# Patient Record
Sex: Male | Born: 1950 | Race: Black or African American | Hispanic: No | Marital: Married | Smoking: Former smoker
Health system: Southern US, Community
[De-identification: ages and names within clinical notes are randomized; demographics above are authoritative.]

## PROBLEM LIST (undated history)

## (undated) DIAGNOSIS — I10 Essential (primary) hypertension: Secondary | ICD-10-CM

---

## 1978-12-09 HISTORY — PX: OTHER SURGICAL HISTORY: SHX169

## 2016-05-24 ENCOUNTER — Emergency Department
Admission: EM | Admit: 2016-05-24 | Discharge: 2016-05-24 | Disposition: A | Discharge status: Home / Self Care | Payer: Medicare Other | Attending: Emergency Medicine | Admitting: Emergency Medicine

## 2016-05-24 ENCOUNTER — Emergency Department: Payer: Medicare Other

## 2016-05-24 ENCOUNTER — Encounter: Payer: Self-pay | Admitting: Emergency Medicine

## 2016-05-24 DIAGNOSIS — R200 Anesthesia of skin: Secondary | ICD-10-CM | POA: Diagnosis present

## 2016-05-24 DIAGNOSIS — I160 Hypertensive urgency: Secondary | ICD-10-CM | POA: Insufficient documentation

## 2016-05-24 DIAGNOSIS — I10 Essential (primary) hypertension: Secondary | ICD-10-CM | POA: Diagnosis not present

## 2016-05-24 HISTORY — DX: Essential (primary) hypertension: I10

## 2016-05-24 LAB — BASIC METABOLIC PANEL
ANION GAP: 9 (ref 5–15)
BUN: 13 mg/dL (ref 6–20)
CALCIUM: 8.9 mg/dL (ref 8.9–10.3)
CO2: 24 mmol/L (ref 22–32)
Chloride: 104 mmol/L (ref 101–111)
Creatinine, Ser: 0.75 mg/dL (ref 0.61–1.24)
GFR calc Af Amer: >60 mL/min (ref >60)
GLUCOSE: 95 mg/dL (ref 65–99)
Potassium: 3.8 mmol/L (ref 3.5–5.1)
SODIUM: 137 mmol/L (ref 135–145)

## 2016-05-24 LAB — CBC
HCT: 37.7 % — ABNORMAL LOW (ref 40.0–52.0)
Hemoglobin: 12.2 g/dL — ABNORMAL LOW (ref 13.0–18.0)
MCH: 24.2 pg — ABNORMAL LOW (ref 26.0–34.0)
MCHC: 32.4 g/dL (ref 32.0–36.0)
MCV: 74.7 fL — AB (ref 80.0–100.0)
PLATELETS: 250 10*3/uL (ref 150–440)
RBC: 5.05 MIL/uL (ref 4.40–5.90)
RDW: 16 % — AB (ref 11.5–14.5)
WBC: 3.8 10*3/uL (ref 3.8–10.6)

## 2016-05-24 LAB — TROPONIN I

## 2016-05-24 MED ORDER — HYDROCHLOROTHIAZIDE 12.5 MG PO TABS: 12.5000 mg | ORAL_TABLET | Freq: Every day | ORAL | Status: AC

## 2016-05-24 MED ORDER — HYDROCHLOROTHIAZIDE 25 MG PO TABS
25.0000 mg | ORAL_TABLET | ORAL | Status: AC
  Administered 2016-05-24: 25 mg via ORAL
  Filled 2016-05-24: qty 1

## 2016-05-24 NOTE — Discharge Instructions (Signed)
° °  Return to the Emergency Department (ED) if you experience any chest pain/pressure/tightness, difficulty breathing, or sudden sweating, or other symptoms that concern you.

## 2016-05-24 NOTE — ED Notes (Signed)
NAD noted at time of D/C. Pt denies questions or concerns. Pt ambulatory to the lobby at this time.  

## 2016-05-24 NOTE — ED Notes (Signed)
Pt from [facility]. Pt states for the past 3 weeks he has had numbness and tingling in both hands and blood pressure also elevated while at [facility]. Pt states he has been off of his HTN med (HCTZ) for the past 2 years.  Pt denies any chest pain.

## 2016-05-24 NOTE — ED Provider Notes (Signed)
[facility] Emergency Department Provider Note  ____________________________________________  Time seen: Approximately 1:24 PM  I have reviewed the triage vital signs and the nursing notes.   HISTORY  Chief Complaint Numbness and Hypertension    HPI Nathan Burgess is a 65 y.o. male presents for evaluation of high blood pressure.  Tells me about 3 weeks ago he was seen in the clinic because his urinated to be cleaned out, but they noticed his blood pressure was quite high there and told them to watch it. Over the last few days she is occasionally note some mild sense of nausea, he also reports he has tingling that occasionally occurs in both hands however this is been off and on for years. He has no facial droop, speech is not changed, he has not had a headache, he denies any vision changes. He has not had chest pain shortness of breath or any other symptoms.  He reports that he thinks he used to be on HCTZ, but was taken off of it several years ago as his blood pressure had improved.   Past Medical History  Diagnosis Date  . Hypertension     There are no active problems to display for this patient.   History reviewed. No pertinent past surgical history.  Current Outpatient Rx  Name  Route  Sig  Dispense  Refill  . hydrochlorothiazide (HYDRODIURIL) 12.5 MG tablet   Oral   Take 1 tablet (12.5 mg total) by mouth daily.   21 tablet   0     Allergies Review of patient's allergies indicates no known allergies.  No family history on file.  Social History Social History  Substance Use Topics  . Smoking status: Never Smoker   . Smokeless tobacco: None  . Alcohol Use: None    Review of Systems Constitutional: No fever/chills Eyes: No visual changes. ENT: No sore throat. Cardiovascular: Denies chest pain. Respiratory: Denies shortness of breath. Gastrointestinal: No abdominal pain. No vomiting.  No diarrhea.  No constipation. Genitourinary:  Negative for dysuria. Musculoskeletal: Negative for back pain. Skin: Negative for rash. Neurological: Negative for headaches, focal weakness or numbness except for slight tingling in the fingers of both hands which she reports has happened on and off for years and unchanged.  10-point ROS otherwise negative.  ____________________________________________   PHYSICAL EXAM:  VITAL SIGNS: ED Triage Vitals  Enc Vitals Group     BP 05/24/16 1220 186/108 mmHg     Pulse Rate 05/24/16 1220 67     Resp 05/24/16 1220 18     Temp 05/24/16 1220 98.4 F (36.9 C)     Temp Source 05/24/16 1220 Oral     SpO2 05/24/16 1220 97 %     Weight 05/24/16 1220 192 lb (87.091 kg)     Height 05/24/16 1220 5\' 11"  (1.803 m)     Head Cir --      Peak Flow --      Pain Score 05/24/16 1218 0     Pain Loc --      Pain Edu? --      Excl. in Will? --    Constitutional: Alert and oriented. Well appearing and in no acute distress. Eyes: Conjunctivae are normal. PERRL. EOMI. Head: Atraumatic. Nose: No congestion/rhinnorhea. Mouth/Throat: Mucous membranes are moist.  Oropharynx non-erythematous. Neck: No stridor.   Cardiovascular: Normal rate, regular rhythm. Grossly normal heart sounds.  Good peripheral circulation. Respiratory: Normal respiratory effort.  No retractions. Lungs CTAB. Gastrointestinal: Soft and nontender.  No distention. No abdominal bruits. No CVA tenderness. Musculoskeletal: No lower extremity tenderness nor edema.  No joint effusions. Neurologic:  Normal speech and language. No gross focal neurologic deficits are appreciated.  The patient has no pronator drift. The patient has normal cranial nerve exam. Extraocular movements are normal. Visual fields are normal. Patient has 5 out of 5 strength in all extremities. There is no numbness or gross, acute sensory abnormality in the extremities bilaterally. No speech disturbance. No dysarthria. No aphasia. No ataxia. Normal finger nose finger  bilat. Patient speaking in full and clear sentences. Skin:  Skin is warm, dry and intact. No rash noted. Psychiatric: Mood and affect are normal. Speech and behavior are normal.  ____________________________________________   LABS (all labs ordered are listed, but only abnormal results are displayed)  Labs Reviewed  CBC - Abnormal; Notable for the following:    Hemoglobin 12.2 (*)    HCT 37.7 (*)    MCV 74.7 (*)    MCH 24.2 (*)    RDW 16.0 (*)    All other components within normal limits  BASIC METABOLIC PANEL  TROPONIN I   ____________________________________________  EKG  Reviewed and interpreted by me at 12:30 PM Ventricular rate 70 PR 140 QRS 90 QTc 420 Normal sinus rhythm, probable left ventricular hypertrophy without ischemic change. Slight repolarization abnormality is noted in V1 and V3 which is likely due to LVH ____________________________________________  RADIOLOGY   CT Head Wo Contrast (Final result) Result time: 05/24/16 13:45:59   Final result by Rad Results In Interface (05/24/16 13:45:59)   Narrative:   CLINICAL DATA: Numbness and tingling in both hands for 3 weeks. Initial encounter.  EXAM: CT HEAD WITHOUT CONTRAST  TECHNIQUE: Contiguous axial images were obtained from the base of the skull through the vertex without intravenous contrast.  COMPARISON: None.  FINDINGS: The brain appears normal without hemorrhage, infarct, mass lesion, mass effect, midline shift or abnormal extra-axial fluid collection. No hydrocephalus or pneumocephalus. The calvarium is intact. Imaged paranasal sinuses and mastoid air cells are clear.  IMPRESSION: Negative head CT.   Electronically Signed By: Inge Rise M.D. On: 05/24/2016 13:45    ____________________________________________   PROCEDURES  Procedure(s) performed: None  Critical Care performed: No  ____________________________________________   INITIAL IMPRESSION / ASSESSMENT  AND PLAN / ED COURSE  Pertinent labs & imaging results that were available during my care of the patient were reviewed by me and considered in my medical decision making (see chart for details).  Patient has for evaluation of some nausea, associated with hypertension. Very reassuring clinical examination. No cardiac or pulmonary symptoms. His blood pressure is 171/98 here, and I believe that he is appropriate to initiate outpatient treatment. There are no signs symptoms to suggest hypertensive emergency. No chest pain, no neuro deficits. He does report paresthesias hands but tells me these of an off-and-on for several years, does not appear to be an acute component.  Patient has a primary care doctor whom he can follow up closely with, we'll provide prescription for hydrochlorothiazide until he can see his primary. I did discuss reviewed careful return precautions with the patient. Return precautions and treatment recommendations and follow-up discussed with the patient who is agreeable with the plan.  ____________________________________________   FINAL CLINICAL IMPRESSION(S) / ED DIAGNOSES  Final diagnoses:  Hypertensive urgency      Delman Kitten, MD 05/25/16 [phone]

## 2019-12-07 ENCOUNTER — Other Ambulatory Visit: Payer: Self-pay | Admitting: Family Medicine

## 2019-12-07 DIAGNOSIS — Z87891 Personal history of nicotine dependence: Secondary | ICD-10-CM

## 2019-12-14 ENCOUNTER — Encounter: Payer: Self-pay | Admitting: Oncology

## 2019-12-14 NOTE — Progress Notes (Signed)
Patient referred by f Dr Netty Starring for decreased white blood cell count, pt is aware. Spoke to patient about what to expect at appointment tomorrow and he voiced understanding. Pt was also prescreened. No other concerns voiced.

## 2019-12-15 ENCOUNTER — Encounter (INDEPENDENT_AMBULATORY_CARE_PROVIDER_SITE_OTHER): Payer: Self-pay

## 2019-12-15 ENCOUNTER — Encounter: Payer: Self-pay | Admitting: Oncology

## 2019-12-15 ENCOUNTER — Inpatient Hospital Stay: Payer: Medicare Other

## 2019-12-15 ENCOUNTER — Inpatient Hospital Stay: Payer: Medicare Other | Attending: Oncology | Admitting: Oncology

## 2019-12-15 ENCOUNTER — Other Ambulatory Visit: Payer: Self-pay

## 2019-12-15 VITALS — BP 150/70 | HR 89 | Temp 97.3°F | Resp 18 | Wt 183.3 lb

## 2019-12-15 DIAGNOSIS — D509 Iron deficiency anemia, unspecified: Secondary | ICD-10-CM | POA: Diagnosis not present

## 2019-12-15 DIAGNOSIS — I10 Essential (primary) hypertension: Secondary | ICD-10-CM | POA: Insufficient documentation

## 2019-12-15 DIAGNOSIS — D72819 Decreased white blood cell count, unspecified: Secondary | ICD-10-CM

## 2019-12-15 DIAGNOSIS — D563 Thalassemia minor: Secondary | ICD-10-CM | POA: Diagnosis not present

## 2019-12-15 DIAGNOSIS — Z87891 Personal history of nicotine dependence: Secondary | ICD-10-CM | POA: Diagnosis not present

## 2019-12-15 DIAGNOSIS — Z79899 Other long term (current) drug therapy: Secondary | ICD-10-CM | POA: Insufficient documentation

## 2019-12-15 DIAGNOSIS — Z791 Long term (current) use of non-steroidal anti-inflammatories (NSAID): Secondary | ICD-10-CM | POA: Insufficient documentation

## 2019-12-15 LAB — HEPATITIS B SURFACE ANTIGEN: Hepatitis B Surface Ag: NONREACTIVE

## 2019-12-15 LAB — COMPREHENSIVE METABOLIC PANEL
ALT: 15 U/L (ref 0–44)
AST: 19 U/L (ref 15–41)
Albumin: 4.6 g/dL (ref 3.5–5.0)
Alkaline Phosphatase: 61 U/L (ref 38–126)
Anion gap: 11 (ref 5–15)
BUN: 22 mg/dL (ref 8–23)
CO2: 23 mmol/L (ref 22–32)
Calcium: 9.6 mg/dL (ref 8.9–10.3)
Chloride: 101 mmol/L (ref 98–111)
Creatinine, Ser: 0.98 mg/dL (ref 0.61–1.24)
GFR calc Af Amer: >60 mL/min (ref >60)
GFR calc non Af Amer: >60 mL/min (ref >60)
Glucose, Bld: 101 mg/dL — ABNORMAL HIGH (ref 70–99)
Potassium: 4 mmol/L (ref 3.5–5.1)
Sodium: 135 mmol/L (ref 135–145)
Total Bilirubin: 0.5 mg/dL (ref 0.3–1.2)
Total Protein: 7.2 g/dL (ref 6.5–8.1)

## 2019-12-15 LAB — IRON AND TIBC
Iron: 84 ug/dL (ref 45–182)
Saturation Ratios: 25 % (ref 17.9–39.5)
TIBC: 339 ug/dL (ref 250–450)
UIBC: 255 ug/dL

## 2019-12-15 LAB — VITAMIN B12: Vitamin B-12: 286 pg/mL (ref 180–914)

## 2019-12-15 LAB — FERRITIN: Ferritin: 245 ng/mL (ref 24–336)

## 2019-12-15 LAB — TSH: TSH: 1.184 u[IU]/mL (ref 0.350–4.500)

## 2019-12-15 LAB — CBC WITH DIFFERENTIAL/PLATELET
Abs Immature Granulocytes: 0.01 10*3/uL (ref 0.00–0.07)
Basophils Absolute: 0 10*3/uL (ref 0.0–0.1)
Basophils Relative: 1 %
Eosinophils Absolute: 0.1 10*3/uL (ref 0.0–0.5)
Eosinophils Relative: 3 %
HCT: 36.4 % — ABNORMAL LOW (ref 39.0–52.0)
Hemoglobin: 11.5 g/dL — ABNORMAL LOW (ref 13.0–17.0)
Immature Granulocytes: 0 %
Lymphocytes Relative: 36 %
Lymphs Abs: 1 10*3/uL (ref 0.7–4.0)
MCH: 25.1 pg — ABNORMAL LOW (ref 26.0–34.0)
MCHC: 31.6 g/dL (ref 30.0–36.0)
MCV: 79.3 fL — ABNORMAL LOW (ref 80.0–100.0)
Monocytes Absolute: 0.1 10*3/uL (ref 0.1–1.0)
Monocytes Relative: 3 %
Neutro Abs: 1.7 10*3/uL (ref 1.7–7.7)
Neutrophils Relative %: 57 %
Platelets: 309 10*3/uL (ref 150–400)
RBC: 4.59 MIL/uL (ref 4.22–5.81)
RDW: 15.4 % (ref 11.5–15.5)
WBC: 2.9 10*3/uL — ABNORMAL LOW (ref 4.0–10.5)
nRBC: 0 % (ref 0.0–0.2)

## 2019-12-15 LAB — FOLATE: Folate: 11.6 ng/mL (ref >5.9)

## 2019-12-15 LAB — HEPATITIS C ANTIBODY: HCV Ab: NONREACTIVE

## 2019-12-15 LAB — TECHNOLOGIST SMEAR REVIEW
RBC Morphology: NORMAL
Tech Review: NORMAL

## 2019-12-15 LAB — HIV ANTIBODY (ROUTINE TESTING W REFLEX): HIV Screen 4th Generation wRfx: NONREACTIVE

## 2019-12-15 LAB — LACTATE DEHYDROGENASE: LDH: 108 U/L (ref 98–192)

## 2019-12-15 NOTE — Progress Notes (Signed)
Hematology/Oncology Consult note [facility] Telephone:([phone] [phone]   Patient Care Team: Dion Body, MD as PCP - General (Family Medicine)  REFERRING PROVIDER: Dion Body, MD  CHIEF COMPLAINTS/REASON FOR VISIT:  Evaluation of leukopenia  HISTORY OF PRESENTING ILLNESS:  Nathan Burgess is a 69 y.o. male who was seen in consultation at the request of Dion Body, MD for evaluation of leukopenia. Reviewed patient's recent labs.  11/30/2019 CBC patient has low total WBC count was 2.9, decreased neutrophil and monocyte increased lymphocyte percentage. Previous lab records reviewed. Leukopenia duration is chronic onset, duration is since June 2019 and at that time his total white count was mildly low at 3.7. Patient also has mild anemia with hemoglobin of 11.8, MCV 79.7 on 11/30/2019.  Chronic onset, anemia dated back to 2017. No aggravating or improving factors.  Associated symptoms:  denies fatigue, weight loss, fever, chills, frequent infection.  History hepatitis or HIV infection: Denies History of chronic liver disease: Denies Alcohol consumption: Not currently Diet Vegetarian or Vegan: Denies Herbal medication: Denies Patient denies night sweating, unintentional weight loss, fever, chills.  He feels that his energy level has decreased in the past few years, otherwise he feels well   Review of Systems  Constitutional: Negative for appetite change, chills, fatigue, fever and unexpected weight change.  HENT:   Negative for hearing loss and voice change.   Eyes: Negative for eye problems and icterus.  Respiratory: Negative for chest tightness, cough and shortness of breath.   Cardiovascular: Negative for chest pain and leg swelling.  Gastrointestinal: Negative for abdominal distention and abdominal pain.  Endocrine: Negative for hot flashes.  Genitourinary: Negative for difficulty urinating, dysuria and frequency.     Musculoskeletal: Negative for arthralgias.  Skin: Negative for itching and rash.  Neurological: Negative for light-headedness and numbness.  Hematological: Negative for adenopathy. Does not bruise/bleed easily.  Psychiatric/Behavioral: Negative for confusion.    MEDICAL HISTORY:  Past Medical History:  Diagnosis Date  . Hypertension     SURGICAL HISTORY: Past Surgical History:  Procedure Laterality Date  . hernia repari  1980   2 herania repair surgeries    SOCIAL HISTORY: Social History   Socioeconomic History  . Marital status: Married    Spouse name: Not on file  . Number of children: Not on file  . Years of education: Not on file  . Highest education level: Not on file  Occupational History  . Occupation: retired   Tobacco Use  . Smoking status: Former Smoker    Packs/day: 1.00    Years: 20.00    Pack years: 20.00    Quit date: 1985    Years since quitting: 36.0  . Smokeless tobacco: Never Used  Substance and Sexual Activity  . Alcohol use: Never  . Drug use: Never  . Sexual activity: Not on file  Other Topics Concern  . Not on file  Social History Narrative  . Not on file   Social Determinants of Health   Financial Resource Strain:   . Difficulty of Paying Living Expenses: Not on file  Food Insecurity:   . Worried About Charity fundraiser in the Last Year: Not on file  . Ran Out of Food in the Last Year: Not on file  Transportation Needs:   . Lack of Transportation (Medical): Not on file  . Lack of Transportation (Non-Medical): Not on file  Physical Activity:   . Days of Exercise per Week: Not on file  . Minutes  of Exercise per Session: Not on file  Stress:   . Feeling of Stress : Not on file  Social Connections:   . Frequency of Communication with Friends and Family: Not on file  . Frequency of Social Gatherings with Friends and Family: Not on file  . Attends Religious Services: Not on file  . Active Member of Clubs or Organizations: Not on  file  . Attends Archivist Meetings: Not on file  . Marital Status: Not on file  Intimate Partner Violence:   . Fear of Current or Ex-Partner: Not on file  . Emotionally Abused: Not on file  . Physically Abused: Not on file  . Sexually Abused: Not on file    FAMILY HISTORY: Family History  Problem Relation Age of Onset  . Dementia Mother   . Dementia Father     ALLERGIES:  has No Known Allergies.  MEDICATIONS:  Current Outpatient Medications  Medication Sig Dispense Refill  . ferrous sulfate 325 (65 FE) MG tablet Take 325 mg by mouth daily with breakfast.    . hydrochlorothiazide (HYDRODIURIL) 12.5 MG tablet Take 1 tablet (12.5 mg total) by mouth daily. (Patient taking differently: Take 25 mg by mouth daily. ) 21 tablet 0  . ibuprofen (GOODSENSE IBUPROFEN) 200 MG tablet Take 200 mg by mouth every 6 (six) hours as needed.    Marland Kitchen lisinopril (ZESTRIL) 10 MG tablet Take 10 mg by mouth daily.      No current facility-administered medications for this visit.     PHYSICAL EXAMINATION: ECOG PERFORMANCE STATUS: 0 - Asymptomatic Vitals:   12/15/19 1100  BP: (!) 150/70  Pulse: 89  Resp: 18  Temp: (!) 97.3 F (36.3 C)   Filed Weights   12/15/19 1100  Weight: 183 lb 4.8 oz (83.1 kg)    Physical Exam Constitutional:      General: He is not in acute distress. HENT:     Head: Normocephalic and atraumatic.  Eyes:     General: No scleral icterus.    Pupils: Pupils are equal, round, and reactive to light.  Cardiovascular:     Rate and Rhythm: Normal rate and regular rhythm.     Heart sounds: Normal heart sounds.  Pulmonary:     Effort: Pulmonary effort is normal. No respiratory distress.     Breath sounds: No wheezing.  Abdominal:     General: Bowel sounds are normal. There is no distension.     Palpations: Abdomen is soft. There is no mass.     Tenderness: There is no abdominal tenderness.  Musculoskeletal:        General: No deformity. Normal range of motion.       Cervical back: Normal range of motion and neck supple.  Skin:    General: Skin is warm and dry.     Findings: No erythema or rash.  Neurological:     Mental Status: He is alert and oriented to person, place, and time.     Cranial Nerves: No cranial nerve deficit.     Coordination: Coordination normal.  Psychiatric:        Mood and Affect: Mood normal.     RADIOGRAPHIC STUDIES: I have personally reviewed the radiological images as listed and agreed with the findings in the report.  CMP Latest Ref Rng & Units 12/15/2019  Glucose 70 - 99 mg/dL 101(H)  BUN 8 - 23 mg/dL 22  Creatinine 0.61 - 1.24 mg/dL 0.98  Sodium 135 - 145 mmol/L 135  Potassium  3.5 - 5.1 mmol/L 4.0  Chloride 98 - 111 mmol/L 101  CO2 22 - 32 mmol/L 23  Calcium 8.9 - 10.3 mg/dL 9.6  Total Protein 6.5 - 8.1 g/dL 7.2  Total Bilirubin 0.3 - 1.2 mg/dL 0.5  Alkaline Phos 38 - 126 U/L 61  AST 15 - 41 U/L 19  ALT 0 - 44 U/L 15   CBC Latest Ref Rng & Units 12/15/2019  WBC 4.0 - 10.5 K/uL 2.9(L)  Hemoglobin 13.0 - 17.0 g/dL 11.5(L)  Hematocrit 39.0 - 52.0 % 36.4(L)  Platelets 150 - 400 K/uL 309    LABORATORY DATA:  I have reviewed the data as listed Lab Results  Component Value Date   WBC 2.9 (L) 12/15/2019   HGB 11.5 (L) 12/15/2019   HCT 36.4 (L) 12/15/2019   MCV 79.3 (L) 12/15/2019   PLT 309 12/15/2019   Recent Labs    12/15/19 1122  NA 135  K 4.0  CL 101  CO2 23  GLUCOSE 101*  BUN 22  CREATININE 0.98  CALCIUM 9.6  GFRNONAA >60  GFRAA >60  PROT 7.2  ALBUMIN 4.6  AST 19  ALT 15  ALKPHOS 61  BILITOT 0.5   Iron/TIBC/Ferritin/ %Sat    Component Value Date/Time   IRON 84 12/15/2019 1122   TIBC 339 12/15/2019 1122   FERRITIN 245 12/15/2019 1122   IRONPCTSAT 25 12/15/2019 1122     RADIOGRAPHIC STUDIES: I have personally reviewed the radiological images as listed and agreed with the findings in the report. No results found.    ASSESSMENT & PLAN:  1. Leukopenia, unspecified type   2.  Microcytic anemia    Leukopenia I discussed with patient that the differential diagnosis of the leukopenia  is broad, including infection, inflammation, nutrition deficiency, malignant etiology including underlying bone morrow disorders.  For the work up of patient's leukopenia, I recommend checking CBC;CMP, LDH; smear review, folate, Vitamin B12, hepatitis, HIV,   Flowcytometry.  TSH  #Chronic microcytic anemia, check iron panel.  Check hemoglobinopathy evaluation # Patient follow-up with me in approximately 2 weeks to review the above results.   Orders Placed This Encounter  Procedures  . CBC with Differential    Standing Status:   Future    Number of Occurrences:   1    Standing Expiration Date:   12/14/2020  . Comprehensive metabolic panel    Standing Status:   Future    Number of Occurrences:   1    Standing Expiration Date:   12/14/2020  . Vitamin B12    Standing Status:   Future    Number of Occurrences:   1    Standing Expiration Date:   12/14/2020  . Folate    Standing Status:   Future    Number of Occurrences:   1    Standing Expiration Date:   12/14/2020  . Flow cytometry panel-leukemia/lymphoma work-up    Standing Status:   Future    Number of Occurrences:   1    Standing Expiration Date:   12/14/2020  . Lactate dehydrogenase    Standing Status:   Future    Number of Occurrences:   1    Standing Expiration Date:   12/14/2020  . Technologist smear review    Standing Status:   Future    Number of Occurrences:   1    Standing Expiration Date:   12/14/2020  . TSH    Standing Status:   Future    Number  of Occurrences:   1    Standing Expiration Date:   12/14/2020  . Iron and TIBC    Standing Status:   Future    Number of Occurrences:   1    Standing Expiration Date:   12/14/2020  . Ferritin    Standing Status:   Future    Number of Occurrences:   1    Standing Expiration Date:   12/14/2020  . Hemoglobinopathy evaluation    Standing Status:   Future    Number of Occurrences:    1    Standing Expiration Date:   12/14/2020  . HIV antibody    Standing Status:   Future    Number of Occurrences:   1    Standing Expiration Date:   12/14/2020  . Hepatitis B surface antigen    Standing Status:   Future    Number of Occurrences:   1    Standing Expiration Date:   12/14/2020  . Hepatitis C antibody    Standing Status:   Future    Number of Occurrences:   1    Standing Expiration Date:   12/14/2020    All questions were answered. The patient knows to call the clinic with any problems questions or concerns.  Cc Dion Body, MD  Return of visit: 2 weeks Thank you for this kind referral and the opportunity to participate in the care of this patient. A copy of today's note is routed to referring provider   Total face to face encounter time for this patient visit was 45 min. >50% of the time was  spent in counseling and coordination of care.    Earlie Server, MD, PhD Hematology Oncology [facility] Pager- [phone] 12/15/2019

## 2019-12-16 LAB — HEMOGLOBINOPATHY EVALUATION
Hgb A2 Quant: 5.6 % — ABNORMAL HIGH (ref 1.8–3.2)
Hgb A: 92.7 % — ABNORMAL LOW (ref 96.4–98.8)
Hgb C: 0 %
Hgb F Quant: 1.7 % (ref 0.0–2.0)
Hgb S Quant: 0 %
Hgb Variant: 0 %

## 2019-12-17 ENCOUNTER — Other Ambulatory Visit: Payer: Self-pay

## 2019-12-17 ENCOUNTER — Ambulatory Visit
Admission: RE | Admit: 2019-12-17 | Discharge: 2019-12-17 | Disposition: A | Discharge status: Home / Self Care | Payer: Medicare Other | Source: Ambulatory Visit | Attending: Family Medicine | Admitting: Family Medicine

## 2019-12-17 DIAGNOSIS — Z87891 Personal history of nicotine dependence: Secondary | ICD-10-CM | POA: Insufficient documentation

## 2019-12-17 DIAGNOSIS — Z136 Encounter for screening for cardiovascular disorders: Secondary | ICD-10-CM | POA: Insufficient documentation

## 2019-12-20 LAB — COMP PANEL: LEUKEMIA/LYMPHOMA

## 2019-12-30 ENCOUNTER — Inpatient Hospital Stay (HOSPITAL_BASED_OUTPATIENT_CLINIC_OR_DEPARTMENT_OTHER): Payer: Medicare Other | Admitting: Oncology

## 2019-12-30 ENCOUNTER — Encounter: Payer: Self-pay | Admitting: Oncology

## 2019-12-30 DIAGNOSIS — D563 Thalassemia minor: Secondary | ICD-10-CM

## 2019-12-30 DIAGNOSIS — D72819 Decreased white blood cell count, unspecified: Secondary | ICD-10-CM | POA: Diagnosis not present

## 2019-12-30 MED ORDER — VITAMIN B-12 1000 MCG PO TABS: 1000.0000 ug | ORAL_TABLET | Freq: Every day | ORAL | 1 refills | Status: AC

## 2019-12-30 NOTE — Progress Notes (Signed)
Patient verified using two identifiers for virtual visit via telephone today.  Patient does not offer any problems today.  

## 2020-01-02 NOTE — Progress Notes (Signed)
HEMATOLOGY-ONCOLOGY TeleHEALTH VISIT PROGRESS NOTE  I connected with Nathan Burgess on 01/02/20 at 10:45 AM EST by video enabled telemedicine visit and verified that I am speaking with the correct person using two identifiers. I discussed the limitations, risks, security and privacy concerns of performing an evaluation and management service by telemedicine and the availability of in-person appointments. I also discussed with the patient that there may be a patient responsible charge related to this service. The patient expressed understanding and agreed to proceed.   Other persons participating in the visit and their role in the encounter:  None  Patient's location: Home  Provider's location: office Chief Complaint: Leukopenia.   INTERVAL HISTORY Nathan Burgess is a 69 y.o. male who has above history reviewed by me today presents for follow up visit for management of leukopenia Problems and complaints are listed below:  Patient has had lab work-up done and present virtually to discuss results.  He has no new complaints.  Feeling well.  Review of Systems  Constitutional: Negative for appetite change, chills, fatigue, fever and unexpected weight change.  HENT:   Negative for hearing loss and voice change.   Eyes: Negative for eye problems and icterus.  Respiratory: Negative for chest tightness, cough and shortness of breath.   Cardiovascular: Negative for chest pain and leg swelling.  Gastrointestinal: Negative for abdominal distention and abdominal pain.  Endocrine: Negative for hot flashes.  Genitourinary: Negative for difficulty urinating, dysuria and frequency.   Musculoskeletal: Negative for arthralgias.  Skin: Negative for itching and rash.  Neurological: Negative for light-headedness and numbness.  Hematological: Negative for adenopathy. Does not bruise/bleed easily.  Psychiatric/Behavioral: Negative for confusion.    Past Medical History:  Diagnosis Date  . Hypertension    Past  Surgical History:  Procedure Laterality Date  . hernia repari  1980   2 herania repair surgeries    Family History  Problem Relation Age of Onset  . Dementia Mother   . Dementia Father     Social History   Socioeconomic History  . Marital status: Married    Spouse name: Not on file  . Number of children: Not on file  . Years of education: Not on file  . Highest education level: Not on file  Occupational History  . Occupation: retired   Tobacco Use  . Smoking status: Former Smoker    Packs/day: 1.00    Years: 20.00    Pack years: 20.00    Quit date: 1985    Years since quitting: 36.0  . Smokeless tobacco: Never Used  Substance and Sexual Activity  . Alcohol use: Never  . Drug use: Never  . Sexual activity: Not on file  Other Topics Concern  . Not on file  Social History Narrative  . Not on file   Social Determinants of Health   Financial Resource Strain:   . Difficulty of Paying Living Expenses: Not on file  Food Insecurity:   . Worried About Charity fundraiser in the Last Year: Not on file  . Ran Out of Food in the Last Year: Not on file  Transportation Needs:   . Lack of Transportation (Medical): Not on file  . Lack of Transportation (Non-Medical): Not on file  Physical Activity:   . Days of Exercise per Week: Not on file  . Minutes of Exercise per Session: Not on file  Stress:   . Feeling of Stress : Not on file  Social Connections:   . Frequency of Communication with  Friends and Family: Not on file  . Frequency of Social Gatherings with Friends and Family: Not on file  . Attends Religious Services: Not on file  . Active Member of Clubs or Organizations: Not on file  . Attends Archivist Meetings: Not on file  . Marital Status: Not on file  Intimate Partner Violence:   . Fear of Current or Ex-Partner: Not on file  . Emotionally Abused: Not on file  . Physically Abused: Not on file  . Sexually Abused: Not on file    Current Outpatient  Medications on File Prior to Visit  Medication Sig Dispense Refill  . hydrochlorothiazide (HYDRODIURIL) 12.5 MG tablet Take 1 tablet (12.5 mg total) by mouth daily. (Patient taking differently: Take 25 mg by mouth daily. ) 21 tablet 0  . ibuprofen (GOODSENSE IBUPROFEN) 200 MG tablet Take 200 mg by mouth every 6 (six) hours as needed.    Marland Kitchen lisinopril (ZESTRIL) 10 MG tablet Take 10 mg by mouth daily.      No current facility-administered medications on file prior to visit.    No Known Allergies     Observations/Objective: Today's Vitals   12/30/19 1042  PainSc: 0-No pain   There is no height or weight on file to calculate BMI.  Physical Exam  Constitutional: No distress.  Psychiatric: Mood normal.    CBC    Component Value Date/Time   WBC 2.9 (L) 12/15/2019 1122   RBC 4.59 12/15/2019 1122   HGB 11.5 (L) 12/15/2019 1122   HCT 36.4 (L) 12/15/2019 1122   PLT 309 12/15/2019 1122   MCV 79.3 (L) 12/15/2019 1122   MCH 25.1 (L) 12/15/2019 1122   MCHC 31.6 12/15/2019 1122   RDW 15.4 12/15/2019 1122   LYMPHSABS 1.0 12/15/2019 1122   MONOABS 0.1 12/15/2019 1122   EOSABS 0.1 12/15/2019 1122   BASOSABS 0.0 12/15/2019 1122    CMP     Component Value Date/Time   NA 135 12/15/2019 1122   K 4.0 12/15/2019 1122   CL 101 12/15/2019 1122   CO2 23 12/15/2019 1122   GLUCOSE 101 (H) 12/15/2019 1122   BUN 22 12/15/2019 1122   CREATININE 0.98 12/15/2019 1122   CALCIUM 9.6 12/15/2019 1122   PROT 7.2 12/15/2019 1122   ALBUMIN 4.6 12/15/2019 1122   AST 19 12/15/2019 1122   ALT 15 12/15/2019 1122   ALKPHOS 61 12/15/2019 1122   BILITOT 0.5 12/15/2019 1122   GFRNONAA >60 12/15/2019 1122   GFRAA >60 12/15/2019 1122     Assessment and Plan: 1. Leukopenia, unspecified type   2. Beta thalassemia minor     Labs are reviewed and discussed with patient. Chronic leukopenia.  Predominantly lymphocytopenia.  Work-up negative for hepatitis, HIV, negative flow cytometry. Vitamin B12 level is low  normal end 286.  I recommend patient to start oral vitamin B12 1000 MCG p.o. daily Supplementation.  Recommend observation for mild leukocytopenia.  Microcytic anemia, iron panel was checked and is normal.  Not consistent with iron deficiency.  Advised patient to start iron supplementation. Hemoglobinopathy evaluation showed that patient has beta thalassemia minor which explains his mild microcytic anemia.    Follow Up Instructions: 6 months   I discussed the assessment and treatment plan with the patient. The patient was provided an opportunity to ask questions and all were answered. The patient agreed with the plan and demonstrated an understanding of the instructions.  The patient was advised to call back or seek an in-person evaluation if  the symptoms worsen or if the condition fails to improve as anticipated.    Earlie Server, MD 01/02/2020 3:56 PM

## 2020-01-31 ENCOUNTER — Other Ambulatory Visit: Payer: Self-pay | Admitting: Neurology

## 2020-01-31 DIAGNOSIS — G250 Essential tremor: Secondary | ICD-10-CM

## 2020-02-10 ENCOUNTER — Other Ambulatory Visit: Payer: Self-pay

## 2020-02-10 ENCOUNTER — Ambulatory Visit
Admission: RE | Admit: 2020-02-10 | Discharge: 2020-02-10 | Disposition: A | Discharge status: Home / Self Care | Payer: Medicare Other | Source: Ambulatory Visit | Attending: Neurology | Admitting: Neurology

## 2020-02-10 DIAGNOSIS — G250 Essential tremor: Secondary | ICD-10-CM

## 2020-03-24 ENCOUNTER — Other Ambulatory Visit: Payer: Self-pay

## 2020-03-24 ENCOUNTER — Inpatient Hospital Stay: Payer: Medicare Other | Attending: Oncology

## 2020-03-24 DIAGNOSIS — E538 Deficiency of other specified B group vitamins: Secondary | ICD-10-CM | POA: Diagnosis not present

## 2020-03-24 DIAGNOSIS — D72819 Decreased white blood cell count, unspecified: Secondary | ICD-10-CM | POA: Diagnosis not present

## 2020-03-24 DIAGNOSIS — Z791 Long term (current) use of non-steroidal anti-inflammatories (NSAID): Secondary | ICD-10-CM | POA: Diagnosis not present

## 2020-03-24 DIAGNOSIS — D563 Thalassemia minor: Secondary | ICD-10-CM | POA: Diagnosis not present

## 2020-03-24 DIAGNOSIS — D509 Iron deficiency anemia, unspecified: Secondary | ICD-10-CM | POA: Diagnosis not present

## 2020-03-24 DIAGNOSIS — Z87891 Personal history of nicotine dependence: Secondary | ICD-10-CM | POA: Diagnosis not present

## 2020-03-24 DIAGNOSIS — Z79899 Other long term (current) drug therapy: Secondary | ICD-10-CM | POA: Insufficient documentation

## 2020-03-24 DIAGNOSIS — I10 Essential (primary) hypertension: Secondary | ICD-10-CM | POA: Diagnosis not present

## 2020-03-24 LAB — CBC WITH DIFFERENTIAL/PLATELET
Abs Immature Granulocytes: 0.01 K/uL (ref 0.00–0.07)
Basophils Absolute: 0 K/uL (ref 0.0–0.1)
Basophils Relative: 1 %
Eosinophils Absolute: 0.1 K/uL (ref 0.0–0.5)
Eosinophils Relative: 2 %
HCT: 33.9 % — ABNORMAL LOW (ref 39.0–52.0)
Hemoglobin: 11.2 g/dL — ABNORMAL LOW (ref 13.0–17.0)
Immature Granulocytes: 0 %
Lymphocytes Relative: 49 %
Lymphs Abs: 1.5 K/uL (ref 0.7–4.0)
MCH: 25.3 pg — ABNORMAL LOW (ref 26.0–34.0)
MCHC: 33 g/dL (ref 30.0–36.0)
MCV: 76.5 fL — ABNORMAL LOW (ref 80.0–100.0)
Monocytes Absolute: 0.1 K/uL (ref 0.1–1.0)
Monocytes Relative: 4 %
Neutro Abs: 1.3 K/uL — ABNORMAL LOW (ref 1.7–7.7)
Neutrophils Relative %: 44 %
Platelets: 297 K/uL (ref 150–400)
RBC: 4.43 MIL/uL (ref 4.22–5.81)
RDW: 15.5 % (ref 11.5–15.5)
WBC: 3.1 K/uL — ABNORMAL LOW (ref 4.0–10.5)
nRBC: 0 % (ref 0.0–0.2)

## 2020-03-24 LAB — IRON AND TIBC
Iron: 82 ug/dL (ref 45–182)
Saturation Ratios: 27 % (ref 17.9–39.5)
TIBC: 308 ug/dL (ref 250–450)
UIBC: 226 ug/dL

## 2020-03-24 LAB — VITAMIN B12: Vitamin B-12: 1304 pg/mL — ABNORMAL HIGH (ref 180–914)

## 2020-03-24 LAB — FERRITIN: Ferritin: 197 ng/mL (ref 24–336)

## 2020-03-27 ENCOUNTER — Other Ambulatory Visit: Payer: Self-pay

## 2020-03-27 ENCOUNTER — Inpatient Hospital Stay: Payer: Medicare Other

## 2020-03-27 ENCOUNTER — Encounter: Payer: Self-pay | Admitting: Oncology

## 2020-03-27 ENCOUNTER — Inpatient Hospital Stay (HOSPITAL_BASED_OUTPATIENT_CLINIC_OR_DEPARTMENT_OTHER): Payer: Medicare Other | Admitting: Oncology

## 2020-03-27 VITALS — BP 129/83 | HR 88 | Temp 96.7°F | Resp 16 | Wt 180.1 lb

## 2020-03-27 DIAGNOSIS — D72819 Decreased white blood cell count, unspecified: Secondary | ICD-10-CM

## 2020-03-27 DIAGNOSIS — D563 Thalassemia minor: Secondary | ICD-10-CM | POA: Diagnosis not present

## 2020-03-27 DIAGNOSIS — D509 Iron deficiency anemia, unspecified: Secondary | ICD-10-CM

## 2020-03-27 NOTE — Progress Notes (Signed)
Hematology/Oncology Consult note [facility] [phone] [phone]   Patient Care Team: Dion Body, MD as PCP - General (Family Medicine)  REFERRING PROVIDER: Dion Body, MD  CHIEF COMPLAINTS/REASON FOR VISIT:  Evaluation of leukopenia  HISTORY OF PRESENTING ILLNESS:  Nathan Burgess is a 69 y.o. male who was seen in consultation at the request of Dion Body, MD for evaluation of leukopenia. Reviewed patient's recent labs.  11/30/2019 CBC patient has low total WBC count was 2.9, decreased neutrophil and monocyte increased lymphocyte percentage. Previous lab records reviewed. Leukopenia duration is chronic onset, duration is since June 2019 and at that time his total white count was mildly low at 3.7. Patient also has mild anemia with hemoglobin of 11.8, MCV 79.7 on 11/30/2019.  Chronic onset, anemia dated back to 2017. No aggravating or improving factors.  Associated symptoms:  denies fatigue, weight loss, fever, chills, frequent infection.  History hepatitis or HIV infection: Denies History of chronic liver disease: Denies Alcohol consumption: Not currently Diet Vegetarian or Vegan: Denies Herbal medication: Denies Patient denies night sweating, unintentional weight loss, fever, chills.  He feels that his energy level has decreased in the past few years, otherwise he feels well  INTERVAL HISTORY Bretton Mayne is a 69 y.o. male who has above history reviewed by me today presents for follow up visit for management of leukopenia Problems and complaints are listed below: Patient has no new complaints today.  He has recently received 2 doses of COVID-19 vaccination Denies weight loss, fever, chills, fatigue, night sweats.      Review of Systems  Constitutional: Negative for appetite change, chills, fatigue, fever and unexpected weight change.  HENT:   Negative for hearing loss and voice change.   Eyes: Negative for eye  problems and icterus.  Respiratory: Negative for chest tightness, cough and shortness of breath.   Cardiovascular: Negative for chest pain and leg swelling.  Gastrointestinal: Negative for abdominal distention and abdominal pain.  Endocrine: Negative for hot flashes.  Genitourinary: Negative for difficulty urinating, dysuria and frequency.   Musculoskeletal: Negative for arthralgias.  Skin: Negative for itching and rash.  Neurological: Negative for light-headedness and numbness.  Hematological: Negative for adenopathy. Does not bruise/bleed easily.  Psychiatric/Behavioral: Negative for confusion.    MEDICAL HISTORY:  Past Medical History:  Diagnosis Date  . Hypertension     SURGICAL HISTORY: Past Surgical History:  Procedure Laterality Date  . hernia repari  1980   2 herania repair surgeries    SOCIAL HISTORY: Social History   Socioeconomic History  . Marital status: Married    Spouse name: Not on file  . Number of children: Not on file  . Years of education: Not on file  . Highest education level: Not on file  Occupational History  . Occupation: retired   Tobacco Use  . Smoking status: Former Smoker    Packs/day: 1.00    Years: 20.00    Pack years: 20.00    Quit date: 1985    Years since quitting: 36.3  . Smokeless tobacco: Never Used  Substance and Sexual Activity  . Alcohol use: Never  . Drug use: Never  . Sexual activity: Not on file  Other Topics Concern  . Not on file  Social History Narrative  . Not on file   Social Determinants of Health   Financial Resource Strain:   . Difficulty of Paying Living Expenses:   Food Insecurity:   . Worried About Charity fundraiser in the  Last Year:   . [place] in the Last Year:   Transportation Needs:   . Film/video editor (Medical):   Marland Kitchen Lack of Transportation (Non-Medical):   Physical Activity:   . Days of Exercise per Week:   . Minutes of Exercise per Session:   Stress:   . Feeling of Stress :    Social Connections:   . Frequency of Communication with Friends and Family:   . Frequency of Social Gatherings with Friends and Family:   . Attends Religious Services:   . Active Member of Clubs or Organizations:   . Attends Archivist Meetings:   Marland Kitchen Marital Status:   Intimate Partner Violence:   . Fear of Current or Ex-Partner:   . Emotionally Abused:   Marland Kitchen Physically Abused:   . Sexually Abused:     FAMILY HISTORY: Family History  Problem Relation Age of Onset  . Dementia Mother   . Dementia Father     ALLERGIES:  has No Known Allergies.  MEDICATIONS:  Current Outpatient Medications  Medication Sig Dispense Refill  . hydrochlorothiazide (HYDRODIURIL) 12.5 MG tablet Take 1 tablet (12.5 mg total) by mouth daily. (Patient taking differently: Take 25 mg by mouth daily. ) 21 tablet 0  . ibuprofen (GOODSENSE IBUPROFEN) 200 MG tablet Take 200 mg by mouth every 6 (six) hours as needed.    Marland Kitchen lisinopril (ZESTRIL) 10 MG tablet Take 10 mg by mouth daily.     . vitamin B-12 (CYANOCOBALAMIN) 1000 MCG tablet Take 1 tablet (1,000 mcg total) by mouth daily. 90 tablet 1  . ergocalciferol (VITAMIN D2) 1.25 MG (50000 UT) capsule Take by mouth.     No current facility-administered medications for this visit.     PHYSICAL EXAMINATION: ECOG PERFORMANCE STATUS: 0 - Asymptomatic Vitals:   03/27/20 0959  BP: 129/83  Pulse: 88  Resp: 16  Temp: (!) 96.7 F (35.9 C)   Filed Weights   03/27/20 0959  Weight: 180 lb 1.6 oz (81.7 kg)    Physical Exam Constitutional:      General: He is not in acute distress. HENT:     Head: Normocephalic and atraumatic.  Eyes:     General: No scleral icterus.    Pupils: Pupils are equal, round, and reactive to light.  Cardiovascular:     Rate and Rhythm: Normal rate and regular rhythm.     Heart sounds: Normal heart sounds.  Pulmonary:     Effort: Pulmonary effort is normal. No respiratory distress.     Breath sounds: No wheezing.    Abdominal:     General: Bowel sounds are normal. There is no distension.     Palpations: Abdomen is soft. There is no mass.     Tenderness: There is no abdominal tenderness.  Musculoskeletal:        General: No deformity. Normal range of motion.     Cervical back: Normal range of motion and neck supple.  Skin:    General: Skin is warm and dry.     Findings: No erythema or rash.  Neurological:     Mental Status: He is alert and oriented to person, place, and time.     Cranial Nerves: No cranial nerve deficit.     Coordination: Coordination normal.  Psychiatric:        Mood and Affect: Mood normal.     RADIOGRAPHIC STUDIES: I have personally reviewed the radiological images as listed and agreed with the findings in the report.  CMP Latest Ref Rng & Units 12/15/2019  Glucose 70 - 99 mg/dL 101(H)  BUN 8 - 23 mg/dL 22  Creatinine 0.61 - 1.24 mg/dL 0.98  Sodium 135 - 145 mmol/L 135  Potassium 3.5 - 5.1 mmol/L 4.0  Chloride 98 - 111 mmol/L 101  CO2 22 - 32 mmol/L 23  Calcium 8.9 - 10.3 mg/dL 9.6  Total Protein 6.5 - 8.1 g/dL 7.2  Total Bilirubin 0.3 - 1.2 mg/dL 0.5  Alkaline Phos 38 - 126 U/L 61  AST 15 - 41 U/L 19  ALT 0 - 44 U/L 15   CBC Latest Ref Rng & Units 03/24/2020  WBC 4.0 - 10.5 K/uL 3.1(L)  Hemoglobin 13.0 - 17.0 g/dL 11.2(L)  Hematocrit 39.0 - 52.0 % 33.9(L)  Platelets 150 - 400 K/uL 297    LABORATORY DATA:  I have reviewed the data as listed Lab Results  Component Value Date   WBC 3.1 (L) 03/24/2020   HGB 11.2 (L) 03/24/2020   HCT 33.9 (L) 03/24/2020   MCV 76.5 (L) 03/24/2020   PLT 297 03/24/2020   Recent Labs    12/15/19 1122  NA 135  K 4.0  CL 101  CO2 23  GLUCOSE 101*  BUN 22  CREATININE 0.98  CALCIUM 9.6  GFRNONAA >60  GFRAA >60  PROT 7.2  ALBUMIN 4.6  AST 19  ALT 15  ALKPHOS 61  BILITOT 0.5   Iron/TIBC/Ferritin/ %Sat    Component Value Date/Time   IRON 82 03/24/2020 1313   TIBC 308 03/24/2020 1313   FERRITIN 197 03/24/2020 1313    IRONPCTSAT 27 03/24/2020 1313     RADIOGRAPHIC STUDIES: I have personally reviewed the radiological images as listed and agreed with the findings in the report. MR BRAIN WO CONTRAST  Result Date: 02/10/2020 CLINICAL DATA:  Tremors. EXAM: MRI HEAD WITHOUT CONTRAST TECHNIQUE: Multiplanar, multiecho pulse sequences of the brain and surrounding structures were obtained without intravenous contrast. COMPARISON:  None. FINDINGS: Brain: No acute infarction, hemorrhage, hydrocephalus, extra-axial collection or mass lesion. A few scattered foci of T2 hyperintensity are seen within the white matter of the cerebral hemispheres, nonspecific, most likely related to chronic small vessel ischemia. Vascular: Normal flow voids. Skull and upper cervical spine: Normal marrow signal. Sinuses/Orbits: Minimal mucosal thickening in the left frontal sinus. negative orbits. Other: None. IMPRESSION: 1. No acute intracranial abnormality. 2. Mild probably chronic ischemic white matter changes. Electronically Signed   By: Pedro Earls M.D.   On: 02/10/2020 17:15      ASSESSMENT & PLAN:  1. Leukopenia, unspecified type   2. Beta thalassemia minor   3. Microcytic anemia    Leukopenia Labs are reviewed and discussed with patient. while total white blood cell count of 3.1, slightly increased comparing to last visit, Absolute neutrophil 1.3, slightly lower than last visit. Recent vaccination, probably contributed to this. 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 He is asymptomatic. Continue to monitor.  #Vitamin B12 deficiency, vitamin B12 has improved to 1300s.  #Chronic microcytic anemia, due to beta thalassemia minor.  Iron panel shows no iron deficiency.  Patient has been advised to stop iron supplementation at the last visit.  # Patient follow-up with me in approximately 2 weeks to review the above results.   Orders Placed This Encounter  Procedures  . CBC with Differential/Platelet     Standing Status:   Future    Standing Expiration Date:   03/27/2021  . Comprehensive metabolic panel    Standing Status:  Future    Standing Expiration Date:   03/27/2021  . Vitamin B12    Standing Status:   Future    Standing Expiration Date:   03/27/2021    All questions were answered. The patient knows to call the clinic with any problems questions or concerns.  Cc Dion Body, MD  Return of visit: 2 weeks     Earlie Server, MD, PhD Hematology Oncology [facility] Pager- [phone] 03/27/2020

## 2020-03-27 NOTE — Progress Notes (Signed)
Patient has tremors that is followed by neurology.  Also getting wok up by PCP for dizziness/fatigue.

## 2020-09-22 ENCOUNTER — Other Ambulatory Visit: Payer: Medicare Other

## 2020-09-25 ENCOUNTER — Ambulatory Visit: Payer: Medicare Other | Admitting: Oncology

## 2020-09-25 ENCOUNTER — Other Ambulatory Visit: Payer: Medicare Other

## 2020-09-26 ENCOUNTER — Ambulatory Visit: Payer: Medicare Other | Admitting: Oncology

## 2021-12-13 ENCOUNTER — Emergency Department: Payer: Medicare Other

## 2021-12-13 ENCOUNTER — Other Ambulatory Visit: Payer: Self-pay

## 2021-12-13 DIAGNOSIS — I10 Essential (primary) hypertension: Secondary | ICD-10-CM | POA: Diagnosis not present

## 2021-12-13 DIAGNOSIS — Z79899 Other long term (current) drug therapy: Secondary | ICD-10-CM | POA: Diagnosis not present

## 2021-12-13 DIAGNOSIS — H81392 Other peripheral vertigo, left ear: Secondary | ICD-10-CM | POA: Diagnosis not present

## 2021-12-13 DIAGNOSIS — H6123 Impacted cerumen, bilateral: Secondary | ICD-10-CM | POA: Diagnosis not present

## 2021-12-13 DIAGNOSIS — D72819 Decreased white blood cell count, unspecified: Secondary | ICD-10-CM | POA: Diagnosis not present

## 2021-12-13 DIAGNOSIS — R42 Dizziness and giddiness: Secondary | ICD-10-CM | POA: Diagnosis present

## 2021-12-13 LAB — CBC
HCT: 35.7 % — ABNORMAL LOW (ref 39.0–52.0)
Hemoglobin: 11.6 g/dL — ABNORMAL LOW (ref 13.0–17.0)
MCH: 26.5 pg (ref 26.0–34.0)
MCHC: 32.5 g/dL (ref 30.0–36.0)
MCV: 81.5 fL (ref 80.0–100.0)
Platelets: 175 10*3/uL (ref 150–400)
RBC: 4.38 MIL/uL (ref 4.22–5.81)
RDW: 15.9 % — ABNORMAL HIGH (ref 11.5–15.5)
WBC: 3.3 10*3/uL — ABNORMAL LOW (ref 4.0–10.5)
nRBC: 0 % (ref 0.0–0.2)

## 2021-12-13 LAB — BASIC METABOLIC PANEL
Anion gap: 8 (ref 5–15)
BUN: 14 mg/dL (ref 8–23)
CO2: 29 mmol/L (ref 22–32)
Calcium: 9.6 mg/dL (ref 8.9–10.3)
Chloride: 102 mmol/L (ref 98–111)
Creatinine, Ser: 0.87 mg/dL (ref 0.61–1.24)
GFR, Estimated: >60 mL/min (ref >60)
Glucose, Bld: 111 mg/dL — ABNORMAL HIGH (ref 70–99)
Potassium: 4.5 mmol/L (ref 3.5–5.1)
Sodium: 139 mmol/L (ref 135–145)

## 2021-12-13 LAB — TROPONIN I (HIGH SENSITIVITY)
Troponin I (High Sensitivity): 4 ng/L (ref <18)
Troponin I (High Sensitivity): 4 ng/L (ref <18)

## 2021-12-13 NOTE — ED Triage Notes (Signed)
Pt to ER via POV with complaints of waking up at 5am and experiencing dizziness/ sweating and centralized non-radiating chest pain. Patient also reports multiple episodes of emesis. Pt reports at this time only experiencing generalized weakness. Denies hx of the same.

## 2021-12-14 ENCOUNTER — Emergency Department
Admission: EM | Admit: 2021-12-14 | Discharge: 2021-12-14 | Disposition: A | Discharge status: Home / Self Care | Payer: Medicare Other | Attending: Emergency Medicine | Admitting: Emergency Medicine

## 2021-12-14 DIAGNOSIS — H81392 Other peripheral vertigo, left ear: Secondary | ICD-10-CM

## 2021-12-14 DIAGNOSIS — H6123 Impacted cerumen, bilateral: Secondary | ICD-10-CM

## 2021-12-14 MED ORDER — ONDANSETRON 4 MG PO TBDP: 4.0000 mg | ORAL_TABLET | Freq: Four times a day (QID) | ORAL | 0 refills | Status: AC | PRN

## 2021-12-14 MED ORDER — MECLIZINE HCL 25 MG PO TABS: 25.0000 mg | ORAL_TABLET | Freq: Three times a day (TID) | ORAL | 0 refills | Status: AC | PRN

## 2021-12-14 MED ORDER — DOCUSATE SODIUM 50 MG/5ML PO LIQD
50.0000 mg | Freq: Once | ORAL | Status: AC
  Administered 2021-12-14: 50 mg via OTIC
  Filled 2021-12-14: qty 10

## 2021-12-14 NOTE — ED Provider Notes (Signed)
[facility] Provider Note    Event Date/Time   First MD Initiated Contact with Patient 12/14/21 0025     (approximate)   History   Dizziness and Chest Pain   HPI  Nathan Burgess is a 71 y.o. male with history of hypertension who presents to the emergency department with vertigo that started this morning when he woke up in bed.  States he felt like the room was spinning and he was diaphoretic and had nausea and vomiting.  States symptoms have resolved but he is feeling weak but he thinks this is due to not eating and drinking because he was scared he may vomit again.  States last night before he went to bed he did have some left ear pain that has resolved.  No tinnitus or hearing loss.  Has never had vertigo before.  No headache or head injury.  No numbness, tingling or focal weakness.  No previous history of stroke.   History provided by patient.      Past Medical History:  Diagnosis Date   Hypertension     Past Surgical History:  Procedure Laterality Date   hernia repari  1980   2 herania repair surgeries    MEDICATIONS:  Prior to Admission medications   Medication Sig Start Date End Date Taking? Authorizing Provider  ergocalciferol (VITAMIN D2) 1.25 MG (50000 UT) capsule Take by mouth. 02/04/20   [provider]  hydrochlorothiazide (HYDRODIURIL) 12.5 MG tablet Take 1 tablet (12.5 mg total) by mouth daily. Patient taking differently: Take 25 mg by mouth daily.  05/24/16   Delman Kitten, MD  ibuprofen (GOODSENSE IBUPROFEN) 200 MG tablet Take 200 mg by mouth every 6 (six) hours as needed.    [provider]  lisinopril (ZESTRIL) 10 MG tablet Take 10 mg by mouth daily.     [provider]  vitamin B-12 (CYANOCOBALAMIN) 1000 MCG tablet Take 1 tablet (1,000 mcg total) by mouth daily. 12/30/19   Earlie Server, MD    Physical Exam   Triage Vital Signs: ED Triage Vitals  Enc Vitals Group     BP 12/13/21 1107 (!) 143/93     Pulse  Rate 12/13/21 1107 71     Resp 12/13/21 1107 16     Temp 12/13/21 1107 97.8 F (36.6 C)     Temp Source 12/13/21 1107 Oral     SpO2 12/13/21 1107 97 %     Weight 12/13/21 1107 187 lb (84.8 kg)     Height 12/13/21 1107 5\' 10"  (1.778 m)     Head Circumference --      Peak Flow --      Pain Score 12/13/21 1107 0     Pain Loc --      Pain Edu? --      Excl. in [place]? --     Most recent vital signs: Vitals:   12/13/21 2120 12/14/21 0359  BP: (!) 157/94 (!) 139/93  Pulse: 66 71  Resp: 16 18  Temp:    SpO2: 99% 99%    CONSTITUTIONAL: Alert and oriented and responds appropriately to questions. Well-appearing; well-nourished HEAD: Normocephalic, atraumatic EYES: Conjunctivae clear, pupils appear equal, sclera nonicteric ENT: normal nose; moist mucous membranes; patient has cerumen impaction bilaterally which obscures his bilateral TMs and most of his external auditory canal.  No signs of mastoiditis. No pain with manipulation of the pinna bilaterally. NECK: Supple, normal ROM CARD: RRR; S1 and S2 appreciated; no murmurs, no clicks, no  rubs, no gallops RESP: Normal chest excursion without splinting or tachypnea; breath sounds clear and equal bilaterally; no wheezes, no rhonchi, no rales, no hypoxia or respiratory distress, speaking full sentences ABD/GI: Normal bowel sounds; non-distended; soft, non-tender, no rebound, no guarding, no peritoneal signs BACK: The back appears normal EXT: Normal ROM in all joints; no deformity noted, no edema; no cyanosis SKIN: Normal color for age and race; warm; no rash on exposed skin NEURO: Moves all extremities equally, normal speech, ambulates with steady gait, no ataxia, sensation to light touch intact diffusely, cranial nerves II through XII intact PSYCH: The patient's mood and manner are appropriate.   ED Results / Procedures / Treatments   LABS: (all labs ordered are listed, but only abnormal results are displayed) Labs Reviewed  BASIC  METABOLIC PANEL - Abnormal; Notable for the following components:      Result Value   Glucose, Bld 111 (*)    All other components within normal limits  CBC - Abnormal; Notable for the following components:   WBC 3.3 (*)    Hemoglobin 11.6 (*)    HCT 35.7 (*)    RDW 15.9 (*)    All other components within normal limits  TROPONIN I (HIGH SENSITIVITY)  TROPONIN I (HIGH SENSITIVITY)     EKG:  EKG Interpretation  Date/Time:  Thursday December 13 2021 11:09:05 EST Ventricular Rate:  79 PR Interval:  156 QRS Duration: 92 QT Interval:  380 QTC Calculation: 435 R Axis:   69 Text Interpretation: Normal sinus rhythm Normal ECG When compared with ECG of 24-May-2016 12:26, No significant change was found Confirmed by Pryor Curia [phone]) on 12/14/2021 2:23:45 AM         RADIOLOGY: My personal review and interpretation of imaging: Chest x-ray shows no abnormality.  I have personally reviewed all radiology reports.   DG Chest 2 View  Result Date: 12/13/2021 CLINICAL DATA:  Provided history: Chest pain. EXAM: CHEST - 2 VIEW COMPARISON:  No pertinent prior exams available for comparison. FINDINGS: Heart size within normal limits. No appreciable airspace consolidation. No evidence of pleural effusion or pneumothorax. No acute bony abnormality identified. Mild thoracic dextrocurvature. IMPRESSION: No evidence of active cardiopulmonary disease. Electronically Signed   By: Kellie Simmering D.O.   On: 12/13/2021 11:42     PROCEDURES:  Critical Care performed: No      .Ear Cerumen Removal  Date/Time: 12/14/2021 3:59 AM Performed by: Dorene Ar, RN Authorized by: Brinda Focht, Delice Bison, DO   Consent:    Consent obtained:  Verbal   Consent given by:  Patient   Risks discussed:  Bleeding, infection, pain, TM perforation, incomplete removal and dizziness   Alternatives discussed:  Alternative treatment and referral Universal protocol:    Procedure explained and questions answered to patient  or proxy's satisfaction: yes     Relevant documents present and verified: yes     Test results available: yes     Imaging studies available: yes     Required blood products, implants, devices, and special equipment available: yes     Site/side marked: yes     Immediately prior to procedure, a time out was called: yes     Patient identity confirmed:  Verbally with patient Procedure details:    Location:  L ear and R ear   Procedure type: irrigation     Procedure outcomes: unable to remove cerumen   Post-procedure details:    Inspection:  TM intact, some cerumen remaining and no bleeding  Hearing quality:  Normal   Procedure completion:  Tolerated well, no immediate complications Comments:     Able to remove a very small amount of cerumen from the left ear and I was now able to visualize the TM which appears intact without redness, bulging, purulence, drainage or perforation.  Right external auditory canal however is still completely obscured due to cerumen.    IMPRESSION / MDM / ASSESSMENT AND PLAN / ED COURSE  I reviewed the triage vital signs and the nursing notes.    Patient here with vertigo.  Symptoms improving.  Able to ambulate.    DIFFERENTIAL DIAGNOSIS (includes but not limited to):   Peripheral vertigo, CVA, intracranial hemorrhage, anemia, electrolyte derangement, hypoglycemia, UTI, dehydration, ACS, arrhythmia   PLAN: Basic labs including CBC, BMP, EKG.  We will use liquid Colace in both ears and irrigate given bilateral cerumen impaction.  Patient not having any vertigo at this time and therefore does not need meclizine or Zofran.  Will p.o. challenge.   MEDICATIONS GIVEN IN ED: Medications  docusate (COLACE) 50 MG/5ML liquid 50 mg (50 mg Both EARS Given 12/14/21 0125)     ED COURSE: Patient able to ambulate here with steady gait.  No ataxia.  Tolerating p.o.  We were able to irrigate his ears and his TMs appear intact without signs of otitis media, otitis externa  or mastoiditis.  I do not feel he needs to be on antibiotics.  We discussed that vertigo could also be from central causes but given he is feeling better with a normal neurologic exam, I feel CVA is less likely and I do not feel he needs an MRI of his brain at this time.  Also do not feel he needs admission given he has no vertigo currently and is able to ambulate and tolerate p.o.  I will prescribe him with a prescription of meclizine to take at home if symptoms return and we will give outpatient ENT follow-up as needed.  Labs reassuring here other than leukopenia which appears to be chronic.  Normal electrolytes.  Mild anemia which is stable..  Troponin x2 normal and flat.  EKG nonischemic without arrhythmia or interval abnormality.  Chest x-ray reviewed by myself and radiologist and is clear.   4:00 AM  Pt still well-appearing and reports feeling much better.  Tolerating p.o. here.  Attempted to irrigate both ears.  We were able to get out a small amount of cerumen from the left ear and I am able to visualize the TM which shows no sign of otitis media and there is no sign of otitis externa.  TM is intact.  Still unable to completely clear wax however from the right external auditory canal.  Will give ENT follow-up information.  I feel he is safe to be discharged with prescription of meclizine to take as needed.  At this time, I do not feel there is any life-threatening condition present. I reviewed all nursing notes, vitals, pertinent previous records.  All labs, EKGs, imaging ordered have been independently reviewed and interpreted by myself.  I have reviewed nursing notes and appropriate previous records.  I feel the patient is safe to be discharged home without further emergent workup and can continue workup as an outpatient as needed. Discussed all findings and treatment plan with patient as well as usual and customary return precautions.  They verbalize understanding and are comfortable with this  plan.  Outpatient follow-up has been provided as needed. All questions have been answered.  CONSULTS: Admission considered but given patient has returned back to his baseline and is neurologically intact, patient safe for discharge home with outpatient follow-up.   OUTSIDE RECORDS REVIEWED: Reviewed patient's previous heme/onc notes that show patient has chronic leukopenia and anemia which appears stable today.         FINAL CLINICAL IMPRESSION(S) / ED DIAGNOSES   Final diagnoses:  Peripheral vertigo involving left ear  Bilateral impacted cerumen     Rx / DC Orders   ED Discharge Orders          Ordered    meclizine (ANTIVERT) 25 MG tablet  3 times daily PRN        12/14/21 0251    ondansetron (ZOFRAN-ODT) 4 MG disintegrating tablet  Every 6 hours PRN        12/14/21 0251             Note:  This document was prepared using Dragon voice recognition software and may include unintentional dictation errors.   Sherol Sabas, Delice Bison, DO 12/14/21 0400

## 2021-12-29 IMAGING — MR MR HEAD W/O CM
12 series · 45 of 48 positions shown · non-contrast
Comparison: None.

CLINICAL DATA: Tremors.

EXAM:
MRI HEAD WITHOUT CONTRAST
TECHNIQUE: Multiplanar, multiecho pulse sequences of the brain and surrounding
structures were obtained without intravenous contrast.

[Series 5: ax dwi_tracew · axial · 3.0mm · 0.60mm/px · z∈[-104,+51]mm · 4 of 48 slices shown]
[im 1/48]
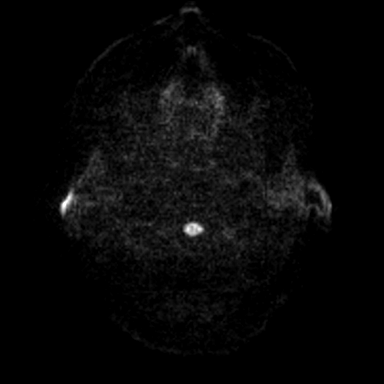
[im 16/48]
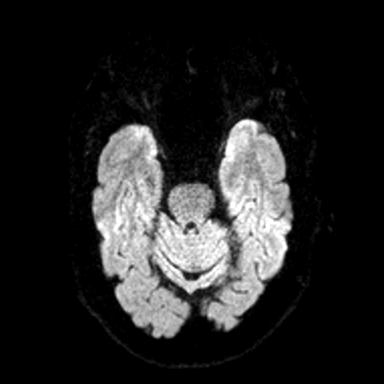
[im 32/48]
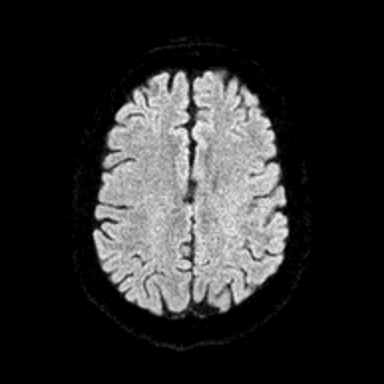
[im 48/48]
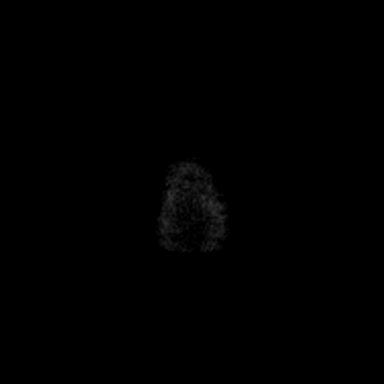

[Series 6: ax dwi_adc · axial · 3.0mm · 0.60mm/px · z∈[-104,+51]mm · 4 of 48 slices shown]
[im 1/48]
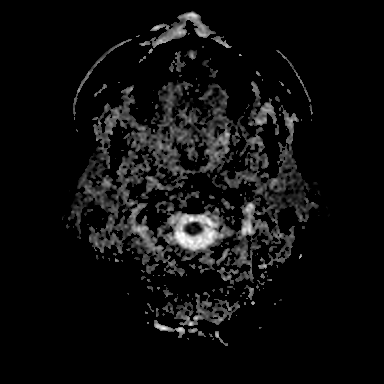
[im 16/48]
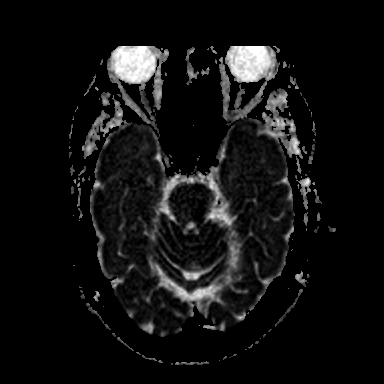
[im 32/48]
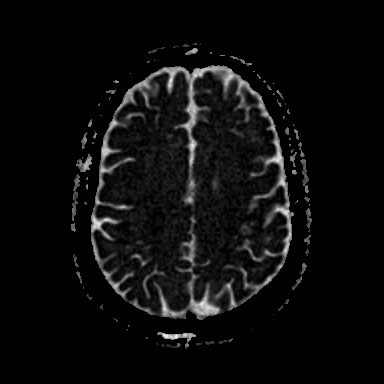
[im 48/48]
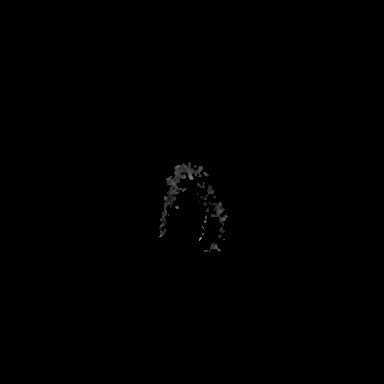

[Series 7: cor dwi_tracew · coronal · 5.0mm · 0.60mm/px · 3 of 38 slices shown]
[im 1/38]
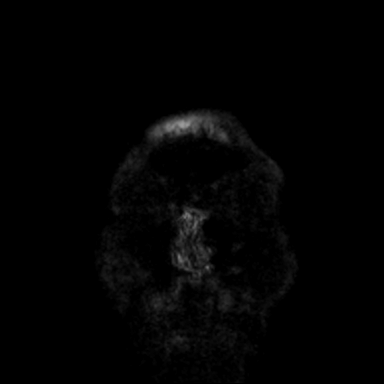
[im 19/38]
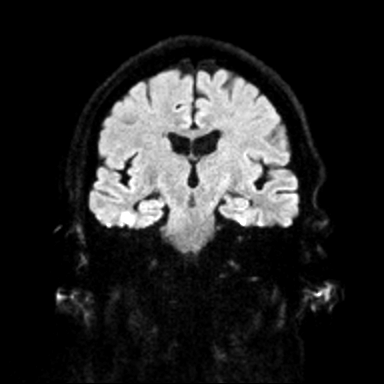
[im 38/38]
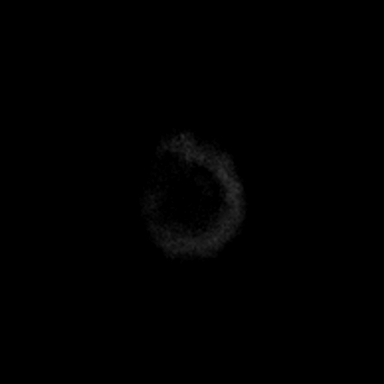

[Series 8: cor dwi_adc · coronal · 5.0mm · 0.60mm/px · 3 of 38 slices shown]
[im 1/38]
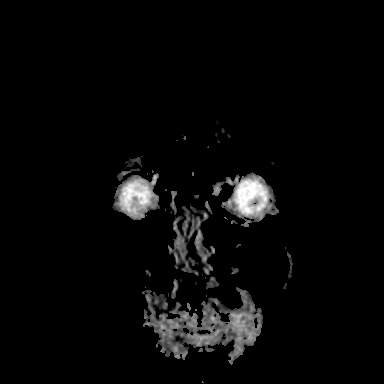
[im 19/38]
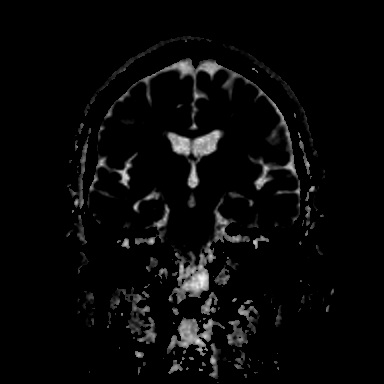
[im 38/38]
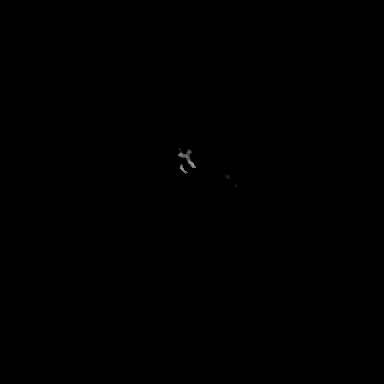

[Series 9: T1 · sagittal · 5.0mm · 0.62mm/px · 2 of 23 slices shown (1 of 2)]
[im 1/23]
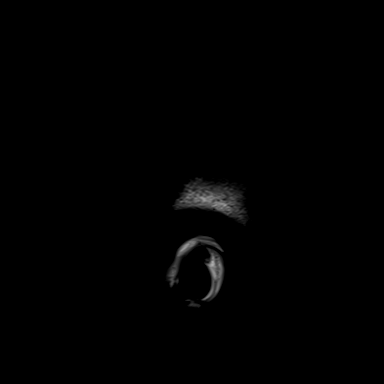
[im 23/23]
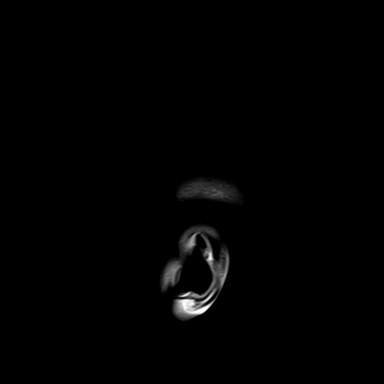

[Series 10: T2 · axial · 5.0mm · 0.53mm/px · z∈[-98,+45]mm · 2 of 25 slices shown (1 of 2)]
[im 1/25]
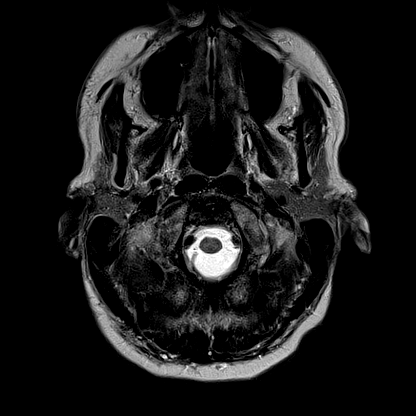
[im 25/25]
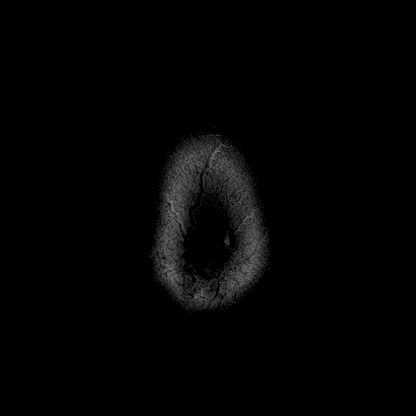

[Series 11: mag_images · axial · 3.0mm · 0.90mm/px · z∈[-114,+62]mm · 4 of 60 slices shown]
[im 1/60]
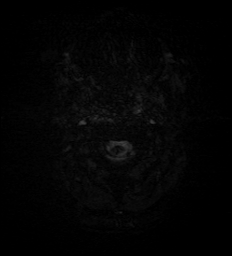
[im 20/60]
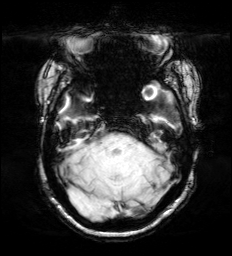
[im 40/60]
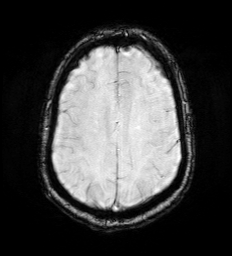
[im 60/60]
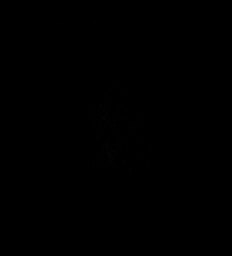

[Series 12: pha_images · axial · 3.0mm · 0.90mm/px · z∈[-114,+59]mm · 4 of 58 slices shown]
[im 1/58]
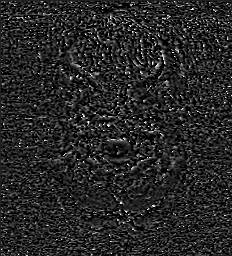
[im 20/58]
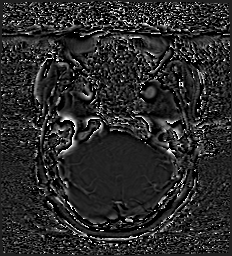
[im 39/58]
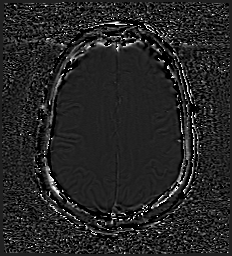
[im 58/58]
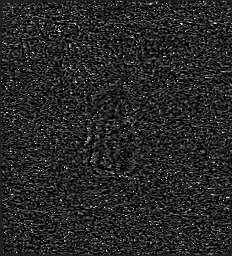

[Series 13: swi_images · axial · 3.0mm · 0.90mm/px · z∈[-114,+62]mm · 4 of 60 slices shown]
[im 1/60]
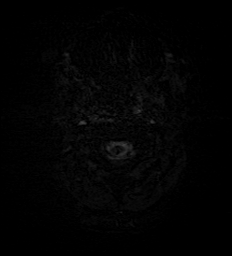
[im 20/60]
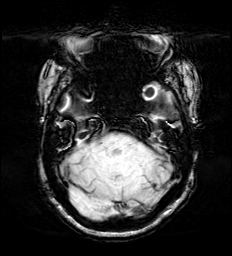
[im 40/60]
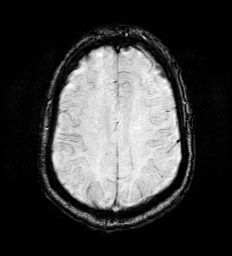
[im 60/60]
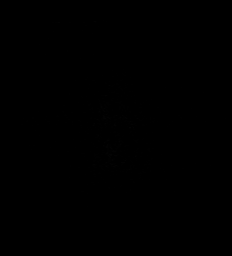

[Series 15: FLAIR · axial · 3.0mm · 0.53mm/px · z∈[-107,+54]mm · 4 of 55 slices shown]
[im 1/55]
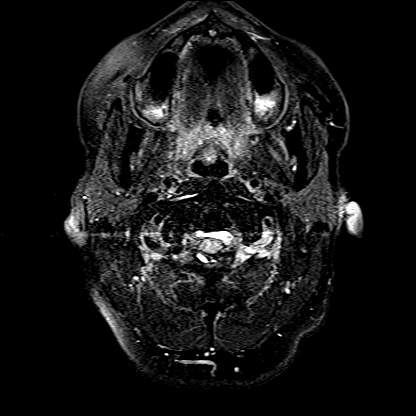
[im 19/55]
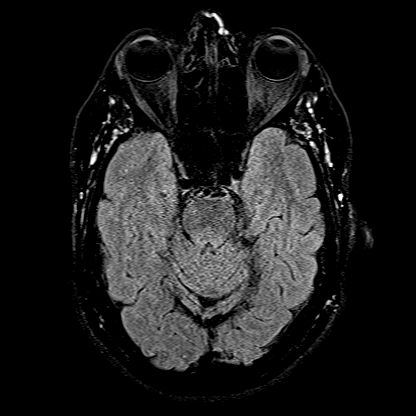
[im 37/55]
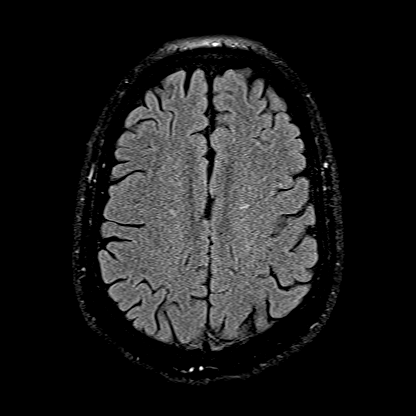
[im 55/55]
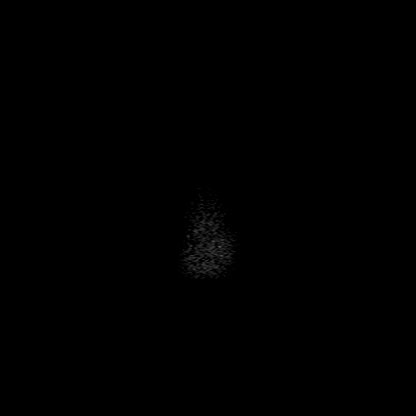

[Series 16: T1 · axial · 1.0mm · 0.98mm/px · z∈[-110,+48]mm · 9 of 160 slices shown (2 of 2)]
[im 1/160]
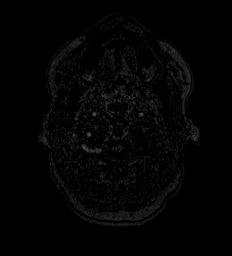
[im 15/160]
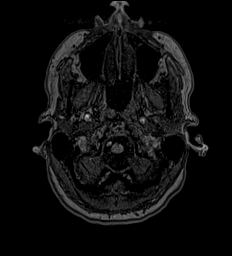
[im 29/160]
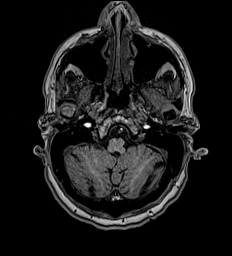
[im 44/160]
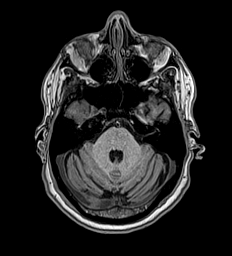
[im 73/160]
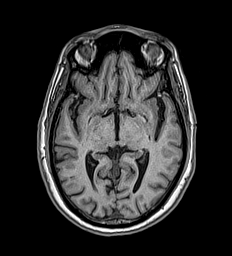
[im 87/160]
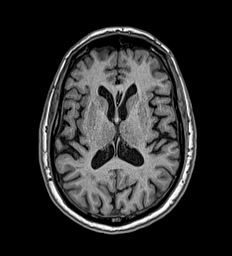
[im 116/160]
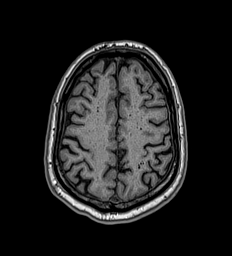
[im 131/160]
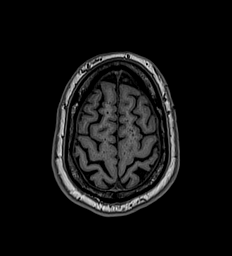
[im 160/160]
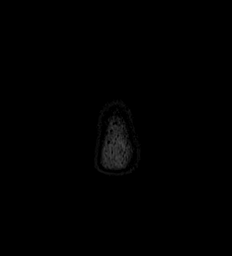

[Series 17: T2 · coronal · 5.0mm · 0.57mm/px · 2 of 29 slices shown (2 of 2)]
[im 1/29]
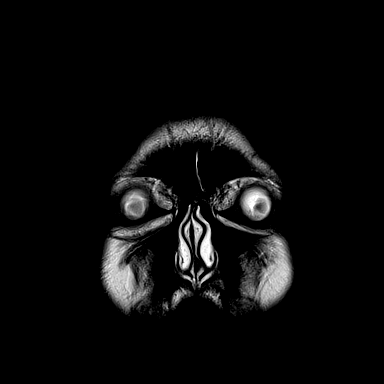
[im 29/29]
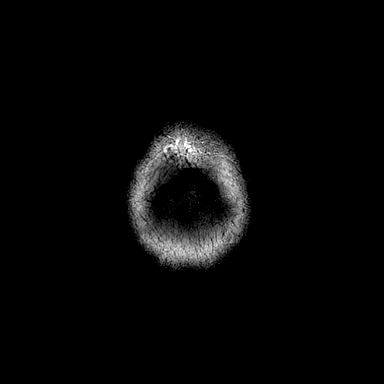

[45 of 48 positions shown; findings below may reference images not displayed]

FINDINGS: Brain: No acute infarction, hemorrhage, hydrocephalus, extra-axial
collection or mass lesion. A few scattered foci of T2 hyperintensity
are seen within the white matter of the cerebral hemispheres,
nonspecific, most likely related to chronic small vessel ischemia.

Vascular: Normal flow voids.

Skull and upper cervical spine: Normal marrow signal.

Sinuses/Orbits: Minimal mucosal thickening in the left frontal
sinus. negative orbits.

Other: None.
IMPRESSION: 1. No acute intracranial abnormality.
2. Mild probably chronic ischemic white matter changes.

## 2023-01-20 DIAGNOSIS — Z86010 Personal history of colonic polyps: Secondary | ICD-10-CM

## 2023-01-20 DIAGNOSIS — Z1211 Encounter for screening for malignant neoplasm of colon: Secondary | ICD-10-CM

## 2023-01-20 DIAGNOSIS — K5730 Diverticulosis of large intestine without perforation or abscess without bleeding: Secondary | ICD-10-CM

## 2023-01-20 DIAGNOSIS — D123 Benign neoplasm of transverse colon: Secondary | ICD-10-CM

## 2024-04-09 ENCOUNTER — Inpatient Hospital Stay

## 2024-04-09 ENCOUNTER — Encounter: Payer: Self-pay | Admitting: Oncology

## 2024-04-09 ENCOUNTER — Inpatient Hospital Stay: Payer: Self-pay | Attending: Oncology | Admitting: Oncology

## 2024-04-09 VITALS — BP 121/74 | HR 79 | Temp 96.1°F | Resp 18 | Wt 182.7 lb

## 2024-04-09 DIAGNOSIS — D709 Neutropenia, unspecified: Secondary | ICD-10-CM

## 2024-04-09 DIAGNOSIS — Z79899 Other long term (current) drug therapy: Secondary | ICD-10-CM | POA: Diagnosis not present

## 2024-04-09 DIAGNOSIS — E538 Deficiency of other specified B group vitamins: Secondary | ICD-10-CM | POA: Diagnosis not present

## 2024-04-09 DIAGNOSIS — Z87891 Personal history of nicotine dependence: Secondary | ICD-10-CM | POA: Diagnosis not present

## 2024-04-09 DIAGNOSIS — D509 Iron deficiency anemia, unspecified: Secondary | ICD-10-CM | POA: Diagnosis not present

## 2024-04-09 DIAGNOSIS — D563 Thalassemia minor: Secondary | ICD-10-CM | POA: Insufficient documentation

## 2024-04-09 DIAGNOSIS — D649 Anemia, unspecified: Secondary | ICD-10-CM

## 2024-04-09 LAB — CBC WITH DIFFERENTIAL/PLATELET
Abs Immature Granulocytes: 0.01 10*3/uL (ref 0.00–0.07)
Basophils Absolute: 0 10*3/uL (ref 0.0–0.1)
Basophils Relative: 1 %
Eosinophils Absolute: 0 10*3/uL (ref 0.0–0.5)
Eosinophils Relative: 1 %
HCT: 34.1 % — ABNORMAL LOW (ref 39.0–52.0)
Hemoglobin: 11.3 g/dL — ABNORMAL LOW (ref 13.0–17.0)
Immature Granulocytes: 1 %
Lymphocytes Relative: 54 %
Lymphs Abs: 1.2 10*3/uL (ref 0.7–4.0)
MCH: 27.8 pg (ref 26.0–34.0)
MCHC: 33.1 g/dL (ref 30.0–36.0)
MCV: 84 fL (ref 80.0–100.0)
Monocytes Absolute: 0.1 10*3/uL (ref 0.1–1.0)
Monocytes Relative: 2 %
Neutro Abs: 0.9 10*3/uL — ABNORMAL LOW (ref 1.7–7.7)
Neutrophils Relative %: 41 %
Platelets: 216 10*3/uL (ref 150–400)
RBC: 4.06 MIL/uL — ABNORMAL LOW (ref 4.22–5.81)
RDW: 16.1 % — ABNORMAL HIGH (ref 11.5–15.5)
WBC: 2.2 10*3/uL — ABNORMAL LOW (ref 4.0–10.5)
nRBC: 0 % (ref 0.0–0.2)

## 2024-04-09 LAB — IRON AND TIBC
Iron: 98 ug/dL (ref 45–182)
Saturation Ratios: 29 % (ref 17.9–39.5)
TIBC: 340 ug/dL (ref 250–450)
UIBC: 242 ug/dL

## 2024-04-09 LAB — TECHNOLOGIST SMEAR REVIEW
Plt Morphology: NORMAL
RBC MORPHOLOGY: NORMAL
WBC MORPHOLOGY: NORMAL

## 2024-04-09 LAB — FOLATE: Folate: 18.2 ng/mL (ref >5.9)

## 2024-04-09 LAB — VITAMIN B12: Vitamin B-12: 396 pg/mL (ref 180–914)

## 2024-04-09 LAB — TSH: TSH: 1.064 u[IU]/mL (ref 0.350–4.500)

## 2024-04-09 LAB — FERRITIN: Ferritin: 255 ng/mL (ref 24–336)

## 2024-04-09 NOTE — Progress Notes (Signed)
 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  Currently ANC is slightly lower than his baseline.  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 Recommend pt to stop empiric iron supplementation.  Check iron tibc ferritin TSH, spep.   Orders Placed This Encounter  Procedures   Ferritin    Standing Status:   Future    Number of Occurrences:   1    Expected Date:   04/09/2024    Expiration Date:   10/10/2024   Iron and TIBC    Standing Status:   Future    Number of Occurrences:   1    Expected Date:   04/09/2024    Expiration Date:   04/09/2025   CBC with Differential/Platelet    Standing Status:   Future    Number of Occurrences:   1    Expected Date:   04/09/2024    Expiration Date:   04/09/2025   Vitamin B12    Standing Status:   Future    Number of Occurrences:   1    Expected Date:   04/09/2024    Expiration Date:   04/09/2025   Folate    Standing Status:   Future    Number of Occurrences:   1    Expected Date:   04/09/2024    Expiration Date:   04/09/2025   Flow cytometry panel-leukemia/lymphoma work-up    Standing Status:   Future    Number of Occurrences:   1    Expected Date:   04/09/2024    Expiration Date:   04/09/2025   Protein electrophoresis, serum    Standing Status:   Future    Number of Occurrences:   1    Expected Date:   04/09/2024    Expiration Date:   04/09/2025   Technologist smear review    Standing Status:   Future    Number of Occurrences:   1    Expected Date:   04/09/2024    Expiration Date:   04/09/2025    Clinical information::   anemia,  leukopenia   TSH    Standing Status:   Future    Number of Occurrences:   1    Expected Date:   04/09/2024    Expiration Date:   04/09/2025   Follow up TBD All questions were answered. The patient knows to call the clinic with any problems, questions or concerns.  Timmy Forbes, MD, PhD [facility] Hematology Oncology 04/09/2024    HISTORY OF PRESENTING ILLNESS:  Nathan Burgess is a 73 y.o. male who was seen in consultation at the request of Monique Ano, MD for evaluation of leukopenia. Patient has a chronic history of leukopenia.  Patient was previously seen by me in 2021 and lost follow-up.  He was referred to establish care with me. Associated symptoms:  denies fatigue, weight loss, fever, chills, frequent infection.  History hepatitis or HIV infection: Denies History of chronic liver disease: Denies Alcohol consumption: Not currently Diet Vegetarian or Vegan: Denies Herbal medication: Denies Patient denies night sweating, unintentional weight loss, fever, chills.  He feels that his energy level has decreased in the  past few years, otherwise he feels well  Review of Systems  Constitutional:  Negative for appetite change, chills, fatigue, fever and unexpected weight change.  HENT:   Negative for hearing loss and voice change.   Eyes:  Negative for eye problems and icterus.  Respiratory:  Negative for chest tightness, cough and shortness of breath.   Cardiovascular:  Negative for chest pain and leg swelling.  Gastrointestinal:  Negative for abdominal distention and abdominal pain.  Endocrine: Negative for hot flashes.  Genitourinary:  Negative for difficulty urinating, dysuria and frequency.   Musculoskeletal:  Negative for arthralgias.  Skin:  Negative for itching and rash.  Neurological:  Negative for light-headedness and numbness.  Hematological:  Negative for adenopathy. Does not bruise/bleed easily.  Psychiatric/Behavioral:  Negative for confusion.     MEDICAL HISTORY:   Past Medical History:  Diagnosis Date   Hypertension     SURGICAL HISTORY: Past Surgical History:  Procedure Laterality Date   hernia repari  1980   2 herania repair surgeries    SOCIAL HISTORY: Social History   Socioeconomic History   Marital status: Married    Spouse name: Not on file   Number of children: Not on file   Years of education: Not on file   Highest education level: Not on file  Occupational History   Occupation: retired   Tobacco Use   Smoking status: Former    Current packs/day: 0.00    Average packs/day: 1 pack/day for 20.0 years (20.0 ttl pk-yrs)    Types: Cigarettes    Start date: 69    Quit date: 1985    Years since quitting: 40.3   Smokeless tobacco: Never  Substance and Sexual Activity   Alcohol use: Never   Drug use: Never   Sexual activity: Not on file  Other Topics Concern   Not on file  Social History Narrative   Not on file   Social Drivers of Health   Financial Resource Strain: Low Risk  (10/15/2023)   Received [facility]   Overall Financial Resource Strain (CARDIA)    Difficulty of Paying Living Expenses: Not hard at all  Food Insecurity: No Food Insecurity (10/15/2023)   Received from [facility]   Hunger Vital Sign    Worried About Running Out of Food in the Last Year: Never true    Ran Out of Food in the Last Year: Never true  Transportation Needs: No Transportation Needs (10/15/2023)   Received from [facility] - Transportation    In the past 12 months, has lack of transportation kept you from medical appointments or from getting medications?: No    Lack of Transportation (Non-Medical): No  Physical Activity: Not on file  Stress: Not on file  Social Connections: Not on file  Intimate Partner Violence: Not on file    FAMILY HISTORY: Family History  Problem Relation Age of Onset   Dementia Mother    Dementia Father     ALLERGIES:  has no known  allergies.  MEDICATIONS:  Current Outpatient Medications  Medication Sig Dispense Refill   ferrous sulfate 325 (65 FE) MG tablet Take 1 tablet by mouth daily with breakfast.     hydrochlorothiazide  (HYDRODIURIL ) 12.5 MG tablet Take 1 tablet (12.5 mg total) by mouth daily. (Patient taking differently: Take 25 mg by mouth daily.) 21 tablet 0   hydrochlorothiazide  (HYDRODIURIL ) 25 MG tablet Take 25 mg by mouth.     ibuprofen (  GOODSENSE IBUPROFEN) 200 MG tablet Take 200 mg by mouth every 6 (six) hours as needed.     lisinopril (ZESTRIL) 10 MG tablet Take 10 mg by mouth daily.      ergocalciferol (VITAMIN D2) 1.25 MG (50000 UT) capsule Take by mouth. (Patient not taking: Reported on 04/09/2024)     meclizine  (ANTIVERT ) 25 MG tablet Take 1 tablet (25 mg total) by mouth 3 (three) times daily as needed for dizziness. (Patient not taking: Reported on 04/09/2024) 30 tablet 0   ondansetron  (ZOFRAN -ODT) 4 MG disintegrating tablet Take 1 tablet (4 mg total) by mouth every 6 (six) hours as needed for nausea or vomiting. (Patient not taking: Reported on 04/09/2024) 20 tablet 0   vitamin B-12 (CYANOCOBALAMIN) 1000 MCG tablet Take 1 tablet (1,000 mcg total) by mouth daily. (Patient not taking: Reported on 04/09/2024) 90 tablet 1   No current facility-administered medications for this visit.     PHYSICAL EXAMINATION: ECOG PERFORMANCE STATUS: 0 - Asymptomatic Vitals:   04/09/24 0936  BP: 121/74  Pulse: 79  Resp: 18  Temp: (!) 96.1 F (35.6 C)   Filed Weights   04/09/24 0936  Weight: 182 lb 11.2 oz (82.9 kg)    Physical Exam Constitutional:      General: He is not in acute distress. HENT:     Head: Normocephalic and atraumatic.  Eyes:     General: No scleral icterus.    Pupils: Pupils are equal, round, and reactive to light.  Cardiovascular:     Rate and Rhythm: Normal rate and regular rhythm.     Heart sounds: Normal heart sounds.  Pulmonary:     Effort: Pulmonary effort is normal. No respiratory  distress.     Breath sounds: No wheezing.  Abdominal:     General: Bowel sounds are normal. There is no distension.     Palpations: Abdomen is soft. There is no mass.     Tenderness: There is no abdominal tenderness.  Musculoskeletal:        General: No deformity. Normal range of motion.     Cervical back: Normal range of motion and neck supple.  Skin:    General: Skin is warm and dry.     Findings: No erythema or rash.  Neurological:     Mental Status: He is alert and oriented to person, place, and time.     Cranial Nerves: No cranial nerve deficit.     Coordination: Coordination normal.  Psychiatric:        Mood and Affect: Mood normal.       LABORATORY DATA:  I have reviewed the data as listed Lab Results  Component Value Date   WBC 2.2 (L) 04/09/2024   HGB 11.3 (L) 04/09/2024   HCT 34.1 (L) 04/09/2024   MCV 84.0 04/09/2024   PLT 216 04/09/2024   No results for input(s): "NA", "K", "CL", "CO2", "GLUCOSE", "BUN", "CREATININE", "CALCIUM", "GFRNONAA", "GFRAA", "PROT", "ALBUMIN", "AST", "ALT", "ALKPHOS", "BILITOT", "BILIDIR", "IBILI" in the last 8760 hours.  Iron/TIBC/Ferritin/ %Sat    Component Value Date/Time   IRON 98 04/09/2024 0959   TIBC 340 04/09/2024 0959   FERRITIN 255 04/09/2024 0959   IRONPCTSAT 29 04/09/2024 0959     RADIOGRAPHIC STUDIES: I have personally reviewed the radiological images as listed and agreed with the findings in the report. No results found.     ASSESSMENT & PLAN:  1. Neutropenia, unspecified type (HCC)   2. Anemia, unspecified type    Leukopenia Labs are reviewed  and discussed with patient. while total white blood cell count of 3.1, slightly increased comparing to last visit, Absolute neutrophil 1.3, slightly lower than last visit. Recent vaccination, probably contributed to this. 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 He is asymptomatic. Continue to monitor.  #Vitamin B12 deficiency, vitamin B12 has improved to  1300s.  #Chronic microcytic anemia, due to beta thalassemia minor.  Iron panel shows no iron deficiency.  Patient has been advised to stop iron supplementation at the last visit.  # Patient follow-up with me in approximately 2 weeks to review the above results.   Orders Placed This Encounter  Procedures   Ferritin    Standing Status:   Future    Number of Occurrences:   1    Expected Date:   04/09/2024    Expiration Date:   10/10/2024   Iron and TIBC    Standing Status:   Future    Number of Occurrences:   1    Expected Date:   04/09/2024    Expiration Date:   04/09/2025   CBC with Differential/Platelet    Standing Status:   Future    Number of Occurrences:   1    Expected Date:   04/09/2024    Expiration Date:   04/09/2025   Vitamin B12    Standing Status:   Future    Number of Occurrences:   1    Expected Date:   04/09/2024    Expiration Date:   04/09/2025   Folate    Standing Status:   Future    Number of Occurrences:   1    Expected Date:   04/09/2024    Expiration Date:   04/09/2025   Flow cytometry panel-leukemia/lymphoma work-up    Standing Status:   Future    Number of Occurrences:   1    Expected Date:   04/09/2024    Expiration Date:   04/09/2025   Protein electrophoresis, serum    Standing Status:   Future    Number of Occurrences:   1    Expected Date:   04/09/2024    Expiration Date:   04/09/2025   Technologist smear review    Standing Status:   Future    Number of Occurrences:   1    Expected Date:   04/09/2024    Expiration Date:   04/09/2025    Clinical information::   anemia, leukopenia   TSH    Standing Status:   Future    Number of Occurrences:   1    Expected Date:   04/09/2024    Expiration Date:   04/09/2025    All questions were answered. The patient knows to call the clinic with any problems questions or concerns.  Cc Monique Ano, MD  Return of visit: 2 weeks     Timmy Forbes, MD, PhD Hematology Oncology [facility] Pager-  [phone] 04/09/2024

## 2024-04-09 NOTE — Assessment & Plan Note (Signed)
 Previous work up indicates ethnic neutropenia.  Currently ANC is slightly lower than his baseline.  Check cbc, smear, B12, folate, flowcytometry

## 2024-04-09 NOTE — Assessment & Plan Note (Addendum)
 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 Recommend pt to stop empiric iron supplementation.  Check iron tibc ferritin TSH, spep.

## 2024-04-13 LAB — PROTEIN ELECTROPHORESIS, SERUM
A/G Ratio: 1.9 — ABNORMAL HIGH (ref 0.7–1.7)
Albumin ELP: 4.3 g/dL (ref 2.9–4.4)
Alpha-1-Globulin: 0.2 g/dL (ref 0.0–0.4)
Alpha-2-Globulin: 0.7 g/dL (ref 0.4–1.0)
Beta Globulin: 0.9 g/dL (ref 0.7–1.3)
Gamma Globulin: 0.5 g/dL (ref 0.4–1.8)
Globulin, Total: 2.3 g/dL (ref 2.2–3.9)
Total Protein ELP: 6.6 g/dL (ref 6.0–8.5)

## 2024-04-13 LAB — COMP PANEL: LEUKEMIA/LYMPHOMA

## 2024-04-14 ENCOUNTER — Telehealth: Payer: Self-pay

## 2024-04-14 DIAGNOSIS — D709 Neutropenia, unspecified: Secondary | ICD-10-CM

## 2024-04-14 NOTE — Telephone Encounter (Signed)
-----   Message from Nathan Burgess sent at 04/13/2024 10:46 PM EDT ----- Please let patient know that his iron level is adequate, DO NOT take oral iron supplement. B12 level is borderline. I recommend him to start B12 1000mcg daily. OTC  Follow up in 3 months lab prior to  MD please order cbc B12. Thanks.

## 2024-04-14 NOTE — Telephone Encounter (Signed)
 Called and spoke to pt and informed him of MD recommendation to stop oral iron and start B12 supplement. Pt verbalized understanding.   Please schedule and call pt with appt details:   3 months: labs prior to MD (cbc, B12)

## 2024-07-22 ENCOUNTER — Inpatient Hospital Stay: Attending: Oncology

## 2024-07-22 DIAGNOSIS — D649 Anemia, unspecified: Secondary | ICD-10-CM | POA: Insufficient documentation

## 2024-07-22 DIAGNOSIS — D709 Neutropenia, unspecified: Secondary | ICD-10-CM | POA: Insufficient documentation

## 2024-07-22 LAB — CBC WITH DIFFERENTIAL (CANCER CENTER ONLY)
Abs Immature Granulocytes: 0 K/uL (ref 0.00–0.07)
Basophils Absolute: 0 K/uL (ref 0.0–0.1)
Basophils Relative: 1 %
Eosinophils Absolute: 0 K/uL (ref 0.0–0.5)
Eosinophils Relative: 1 %
HCT: 31.6 % — ABNORMAL LOW (ref 39.0–52.0)
Hemoglobin: 10.3 g/dL — ABNORMAL LOW (ref 13.0–17.0)
Immature Granulocytes: 0 %
Lymphocytes Relative: 52 %
Lymphs Abs: 1.2 K/uL (ref 0.7–4.0)
MCH: 27.8 pg (ref 26.0–34.0)
MCHC: 32.6 g/dL (ref 30.0–36.0)
MCV: 85.2 fL (ref 80.0–100.0)
Monocytes Absolute: 0 K/uL — ABNORMAL LOW (ref 0.1–1.0)
Monocytes Relative: 2 %
Neutro Abs: 1 K/uL — ABNORMAL LOW (ref 1.7–7.7)
Neutrophils Relative %: 44 %
Platelet Count: 225 K/uL (ref 150–400)
RBC: 3.71 MIL/uL — ABNORMAL LOW (ref 4.22–5.81)
RDW: 16 % — ABNORMAL HIGH (ref 11.5–15.5)
WBC Count: 2.2 K/uL — ABNORMAL LOW (ref 4.0–10.5)
nRBC: 0 % (ref 0.0–0.2)

## 2024-07-22 LAB — VITAMIN B12: Vitamin B-12: 1260 pg/mL — ABNORMAL HIGH (ref 180–914)

## 2024-07-29 ENCOUNTER — Inpatient Hospital Stay: Admitting: Oncology

## 2024-07-29 NOTE — Assessment & Plan Note (Deleted)
 Previous work up indicates ethnic neutropenia.  Currently ANC is slightly lower than his baseline.  Check cbc, smear, B12, folate, flowcytometry
